# Patient Record
Sex: Female | Born: 1985 | Race: White | Hispanic: No | Marital: Single | Smoking: Current every day smoker
Health system: Southern US, Community
[De-identification: ages and names within clinical notes are randomized; demographics above are authoritative.]

## PROBLEM LIST (undated history)

## (undated) DIAGNOSIS — E119 Type 2 diabetes mellitus without complications: Secondary | ICD-10-CM

## (undated) DIAGNOSIS — G43909 Migraine, unspecified, not intractable, without status migrainosus: Secondary | ICD-10-CM

## (undated) DIAGNOSIS — F209 Schizophrenia, unspecified: Secondary | ICD-10-CM

## (undated) HISTORY — PX: LAPAROSCOPIC OOPHERECTOMY: SHX6507

## (undated) HISTORY — PX: GANGLION CYST EXCISION: SHX1691

---

## 2008-08-29 ENCOUNTER — Emergency Department: Payer: Self-pay | Admitting: Unknown Physician Specialty

## 2008-09-05 ENCOUNTER — Emergency Department: Payer: Self-pay | Admitting: Emergency Medicine

## 2008-09-15 ENCOUNTER — Emergency Department: Payer: Self-pay | Admitting: Emergency Medicine

## 2008-10-03 ENCOUNTER — Emergency Department: Payer: Self-pay | Admitting: Emergency Medicine

## 2008-10-28 ENCOUNTER — Emergency Department: Payer: Self-pay

## 2008-12-13 ENCOUNTER — Inpatient Hospital Stay: Payer: Self-pay | Admitting: Psychiatry

## 2009-01-15 ENCOUNTER — Ambulatory Visit: Payer: Self-pay | Admitting: Unknown Physician Specialty

## 2009-03-13 ENCOUNTER — Emergency Department: Payer: Self-pay | Admitting: Emergency Medicine

## 2009-08-23 ENCOUNTER — Inpatient Hospital Stay: Payer: Self-pay | Admitting: Psychiatry

## 2009-09-13 ENCOUNTER — Emergency Department: Payer: Self-pay | Admitting: Emergency Medicine

## 2010-03-06 ENCOUNTER — Emergency Department: Payer: Self-pay | Admitting: Emergency Medicine

## 2010-04-07 ENCOUNTER — Inpatient Hospital Stay: Payer: Self-pay | Admitting: Unknown Physician Specialty

## 2010-04-16 ENCOUNTER — Inpatient Hospital Stay: Payer: Self-pay | Admitting: Psychiatry

## 2010-04-27 ENCOUNTER — Emergency Department: Payer: Self-pay | Admitting: Emergency Medicine

## 2010-06-21 ENCOUNTER — Emergency Department: Payer: Self-pay | Admitting: Emergency Medicine

## 2010-08-20 ENCOUNTER — Inpatient Hospital Stay: Payer: Self-pay | Admitting: Psychiatry

## 2011-05-18 ENCOUNTER — Emergency Department: Payer: Self-pay

## 2011-07-07 ENCOUNTER — Emergency Department: Payer: Self-pay | Admitting: Emergency Medicine

## 2012-02-28 IMAGING — CR DG CHEST 2V
1 series · 2 of 2 positions shown · non-contrast
Comparison: none

REASON FOR EXAM: cough
COMMENTS:

PROCEDURE:     DXR - DXR CHEST PA (OR AP) AND LATERAL  - April 07, 2010  [DATE]
RESULT:     There is mild basilar atelectasis. The lungs are otherwise
clear. The cardiovascular structures are unremarkable.

[Series 1: view not recorded · 0.17mm/px · 2 of 2 slices shown]
[im 1/2]
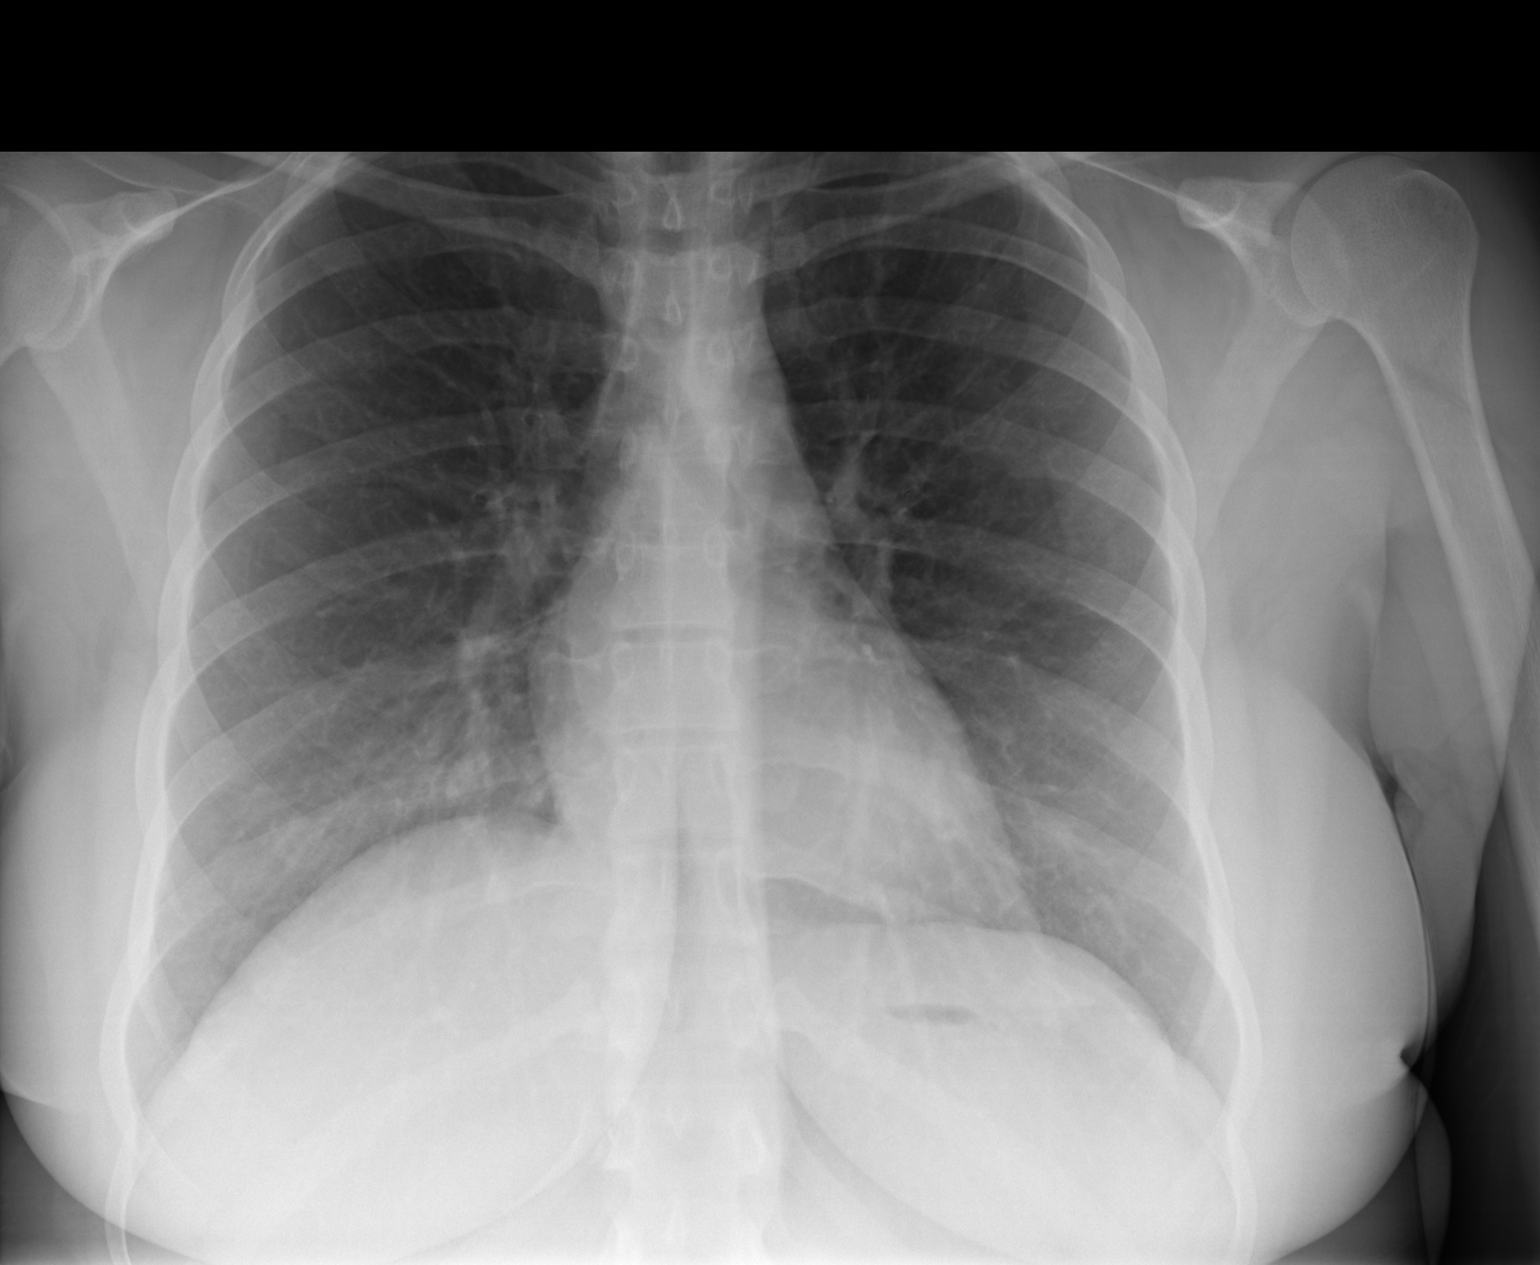
[im 2/2]
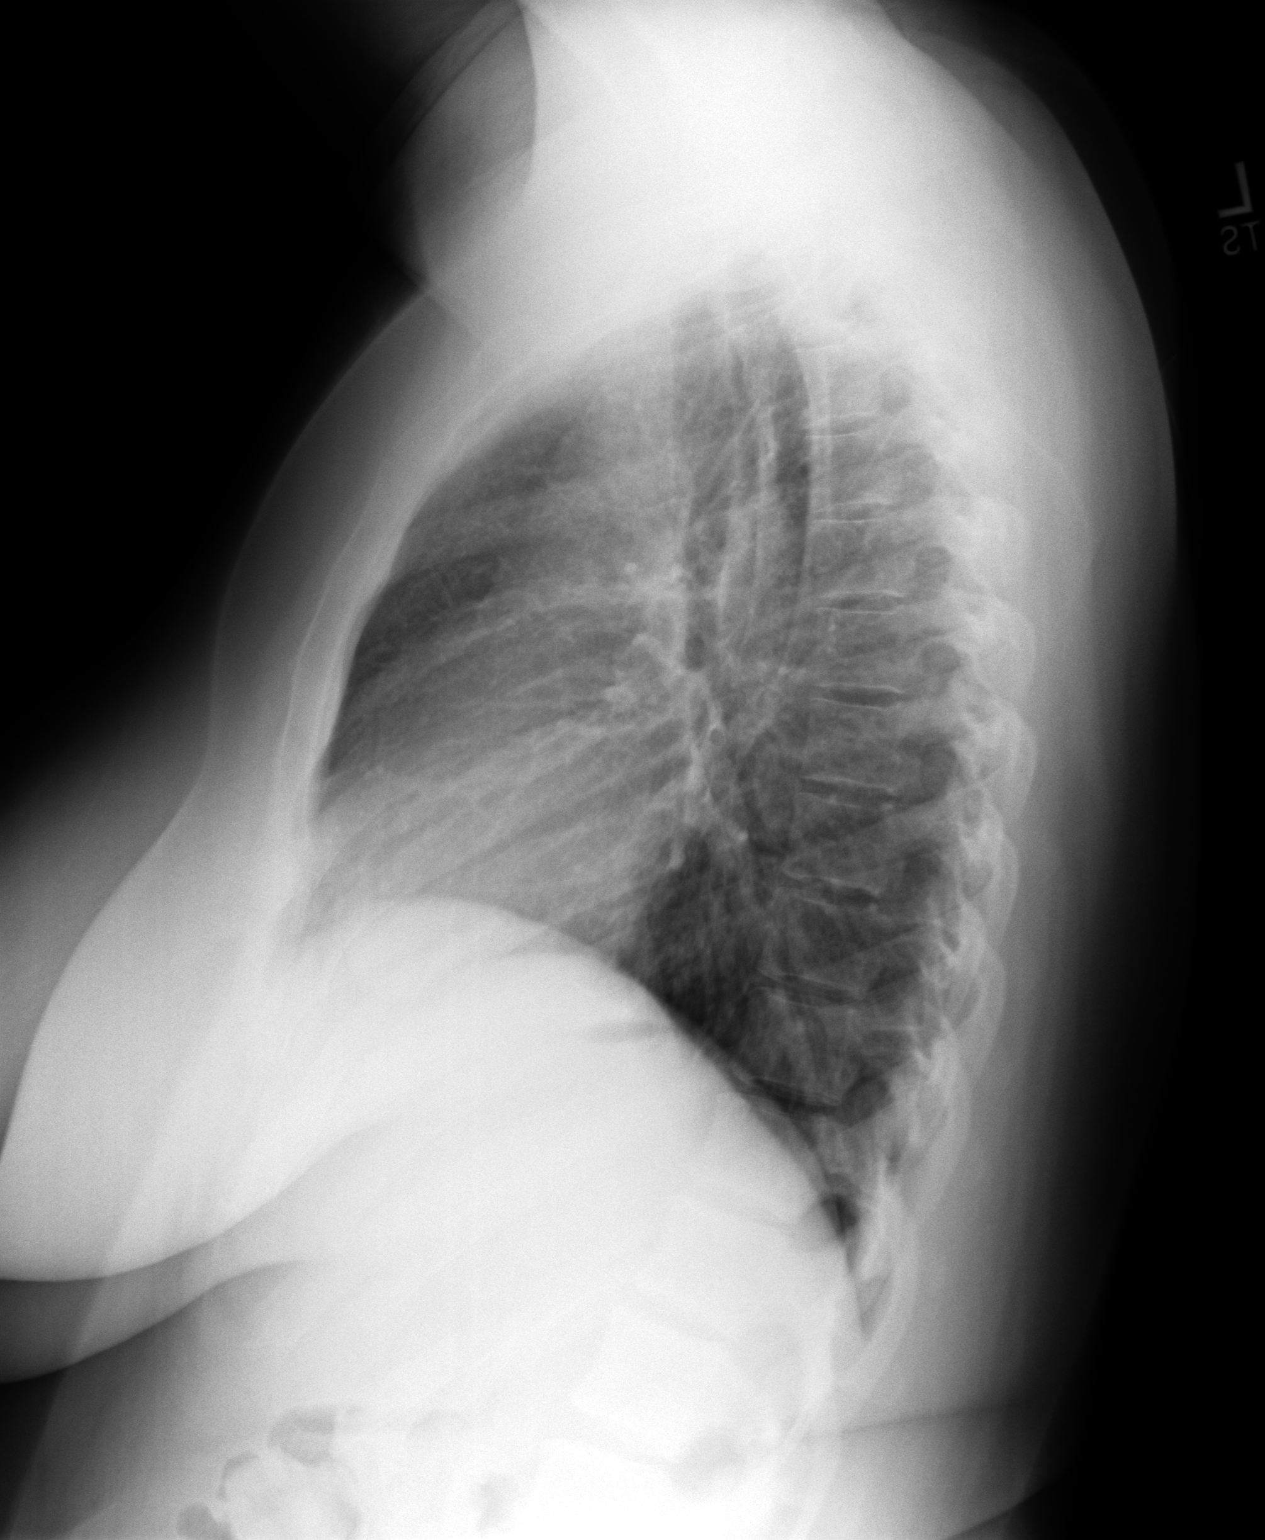

[2 of 2 positions shown; findings below may reference images not displayed]

IMPRESSION: Mild basilar atelectasis.

## 2013-10-18 ENCOUNTER — Ambulatory Visit: Payer: Self-pay | Admitting: Surgery

## 2013-10-18 LAB — CBC WITH DIFFERENTIAL/PLATELET
Basophil #: 0.1 10*3/uL (ref 0.0–0.1)
Basophil %: 0.8 %
EOS PCT: 3.3 %
Eosinophil #: 0.6 10*3/uL (ref 0.0–0.7)
HCT: 40.6 % (ref 35.0–47.0)
HGB: 13.5 g/dL (ref 12.0–16.0)
Lymphocyte #: 4.4 10*3/uL — ABNORMAL HIGH (ref 1.0–3.6)
Lymphocyte %: 24.9 %
MCH: 29.8 pg (ref 26.0–34.0)
MCHC: 33.2 g/dL (ref 32.0–36.0)
MCV: 90 fL (ref 80–100)
MONOS PCT: 5.1 %
Monocyte #: 0.9 x10 3/mm (ref 0.2–0.9)
NEUTROS PCT: 65.9 %
Neutrophil #: 11.6 10*3/uL — ABNORMAL HIGH (ref 1.4–6.5)
PLATELETS: 400 10*3/uL (ref 150–440)
RBC: 4.52 10*6/uL (ref 3.80–5.20)
RDW: 13.5 % (ref 11.5–14.5)
WBC: 17.6 10*3/uL — ABNORMAL HIGH (ref 3.6–11.0)

## 2013-10-18 LAB — BASIC METABOLIC PANEL WITH GFR
Anion Gap: 12
BUN: 10 mg/dL
Calcium, Total: 9.5 mg/dL
Chloride: 98 mmol/L
Co2: 28 mmol/L
Creatinine: 0.85 mg/dL
EGFR (African American): >60
EGFR (Non-African Amer.): >60
Glucose: 295 mg/dL — ABNORMAL HIGH
Osmolality: 286
Potassium: 4 mmol/L
Sodium: 138 mmol/L

## 2013-10-19 ENCOUNTER — Ambulatory Visit: Payer: Self-pay | Admitting: Surgery

## 2017-03-30 ENCOUNTER — Emergency Department: Payer: Medicare Other

## 2017-03-30 ENCOUNTER — Encounter: Payer: Self-pay | Admitting: Emergency Medicine

## 2017-03-30 DIAGNOSIS — S9031XA Contusion of right foot, initial encounter: Secondary | ICD-10-CM | POA: Diagnosis not present

## 2017-03-30 DIAGNOSIS — W109XXA Fall (on) (from) unspecified stairs and steps, initial encounter: Secondary | ICD-10-CM | POA: Diagnosis not present

## 2017-03-30 DIAGNOSIS — Y999 Unspecified external cause status: Secondary | ICD-10-CM | POA: Insufficient documentation

## 2017-03-30 DIAGNOSIS — Y929 Unspecified place or not applicable: Secondary | ICD-10-CM | POA: Insufficient documentation

## 2017-03-30 DIAGNOSIS — Y9301 Activity, walking, marching and hiking: Secondary | ICD-10-CM | POA: Insufficient documentation

## 2017-03-30 DIAGNOSIS — F17210 Nicotine dependence, cigarettes, uncomplicated: Secondary | ICD-10-CM | POA: Diagnosis not present

## 2017-03-30 DIAGNOSIS — S99921A Unspecified injury of right foot, initial encounter: Secondary | ICD-10-CM | POA: Diagnosis present

## 2017-03-30 DIAGNOSIS — E119 Type 2 diabetes mellitus without complications: Secondary | ICD-10-CM | POA: Insufficient documentation

## 2017-03-30 NOTE — ED Triage Notes (Signed)
Pt to triage via w/c with no distress noted; st yest slipped down steps injuring rt foot

## 2017-03-31 ENCOUNTER — Emergency Department: Payer: Medicare Other

## 2017-03-31 ENCOUNTER — Emergency Department
Admission: EM | Admit: 2017-03-31 | Discharge: 2017-03-31 | Disposition: A | Discharge status: Home / Self Care | Payer: Medicare Other | Attending: Emergency Medicine | Admitting: Emergency Medicine

## 2017-03-31 DIAGNOSIS — S9031XA Contusion of right foot, initial encounter: Secondary | ICD-10-CM

## 2017-03-31 HISTORY — DX: Type 2 diabetes mellitus without complications: E11.9

## 2017-03-31 MED ORDER — IBUPROFEN 800 MG PO TABS
800.0000 mg | ORAL_TABLET | ORAL | Status: AC
  Administered 2017-03-31: 800 mg via ORAL
  Filled 2017-03-31: qty 1

## 2017-03-31 NOTE — ED Provider Notes (Signed)
[facility] Emergency Department Provider Note   ____________________________________________   First MD Initiated Contact with Patient 03/31/17 0211     (approximate)  I have reviewed the triage vital signs and the nursing notes.   HISTORY  Chief Complaint Foot Pain   HPI Olivia Olson is a 31 y.o. female here for evaluation of right foot pain  Patient reports she was walking down a slippery step when she fell striking her foot getsagainst a ledge. This occurred about 2 days ago. Since that time the foot has been sore and slightly swollen across the top. She is able to walk on a per reports to uncomfortable. Has also noticed a slight aching and discomfort just below the knee joint when bearing weight, pointing to the lateral proximal tib-fib. She did not see any cut or injury there. Denies pregnancy. She is a type I diabetic, reports her glucose has been good well-controlled and she's had an increased insulin regimen recently  No fevers or chills. No redness.no numbness or tingling. No head injury. Denies any other injuries. Denies pregnancy  Past Medical History:  Diagnosis Date  . Diabetes mellitus without complication (HCC)     There are no active problems to display for this patient.   Past Surgical History:  Procedure Laterality Date  . CESAREAN SECTION    . GANGLION CYST EXCISION Left   . LAPAROSCOPIC OOPHERECTOMY      Prior to Admission medications   Not on File  insulin  Allergies Morphine and related; Risperdal [risperidone]; and Vicodin [hydrocodone-acetaminophen]  No family history on file.  Social History Social History  Substance Use Topics  . Smoking status: Current Every Day Smoker    Packs/day: 0.50    Types: Cigarettes  . Smokeless tobacco: Not on file  . Alcohol use Not on file    Review of Systems Constitutional: No fever/chills Cardiovascular: Denies chest pain. Respiratory: Denies shortness of  breath. Gastrointestinal: No abdominal pain.  No nausea, no vomiting.  Genitourinary: Negative for dysuria. Musculoskeletal: Negative for back pain. Skin: Negative for rash. Neurological: Negative for headaches, focal weakness or numbness.    ____________________________________________   PHYSICAL EXAM:  VITAL SIGNS: ED Triage Vitals  Enc Vitals Group     BP 03/30/17 2340 (!) 147/93     Pulse Rate 03/30/17 2340 (!) 120     Resp 03/30/17 2340 20     Temp 03/30/17 2340 98.3 F (36.8 C)     Temp Source 03/30/17 2340 Oral     SpO2 03/30/17 2340 98 %     Weight 03/30/17 2337 230 lb (104.3 kg)     Height 03/30/17 2337  (1.702 m)     Head Circumference --      Peak Flow --      Pain Score 03/30/17 2337 7     Pain Loc --      Pain Edu? --      Excl. in GC? --     Constitutional: Alert and oriented. Well appearing and in no acute distress.patient and her mother are both very pleasant Eyes: Conjunctivae are normal. Head: Atraumatic. Nose: No congestion/rhinnorhea. Mouth/Throat: Mucous membranes are moist. Neck: No stridor.   Cardiovascular: Normal rate, regular rhythm. Grossly normal heart sounds.  Good peripheral circulation. Respiratory: Normal respiratory effort.  No retractions. Lungs CTAB. Gastrointestinal: Soft and nontender. somewhat obese. Musculoskeletal:   Lower Extremities  No edema. Normal DP/PT pulses bilateral with good cap refill.  Normal neuro-motor function lower  extremities bilateral.  RIGHT Right lower extremity demonstrates normal strength, good use of all muscles. No edema bruising or contusions of the right hip, right knee, right ankle. the dorsal surface of the foot midfoot is slightly swollen and minimally tender to pain without deformity. There is also mild tenderness over the proximal fibula without deformity or overlying lesion. Full range of motion of the knee without deficit. Normal strength throughout. Normal dorsi and plantar flexion. Mild  to moderate tenderness over the anterior tibial fibular ligament. Full range of motion of the right lower extremity without pain. No pain on axial loading. No evidence of trauma.  LEFT Left lower extremity demonstrates normal strength, good use of all muscles. No edema bruising or contusions of the hip,  knee, ankle. Full range of motion of the left lower extremity without pain. No pain on axial loading. No evidence of trauma.   Neurologic:  Normal speech and language. No gross focal neurologic deficits are appreciated.  Skin:  Skin is warm, dry and intact. No rash noted. Psychiatric: Mood and affect are normal. Speech and behavior are normal.  ____________________________________________   LABS (all labs ordered are listed, but only abnormal results are displayed)  Labs Reviewed - No data to display ____________________________________________  EKG   ____________________________________________  RADIOLOGY  x-rays of the right tib-fib and foot are negative for fracture or acute. ____________________________________________   PROCEDURES  Procedure(s) performed: None  Procedures  Critical Care performed: No  ____________________________________________   INITIAL IMPRESSION / ASSESSMENT AND PLAN / ED COURSE  Pertinent labs & imaging results that were available during my care of the patient were reviewed by me and considered in my medical decision making (see chart for details).  fall. No head injury or chest or abdominal trauma. Alert and well oriented, nontoxic. Initially slightly tachycardic on arrival, but normal on my exam.  she has evidence of likely ligamentous sprain of the right foot. No pain or discomfort over the ankle joint. Minimal tenderness over the fibula, an x-ray is negative.  Discussed conservative measures, weightbearing as tolerated, Ace wrap and close follow-up. Return precautions and treatment recommendations and follow-up discussed with the patient  who is agreeable with the plan.       ____________________________________________   FINAL CLINICAL IMPRESSION(S) / ED DIAGNOSES  Final diagnoses:  Contusion of right foot, initial encounter      NEW MEDICATIONS STARTED DURING THIS VISIT:  New Prescriptions   No medications on file     Note:  This document was prepared using Dragon voice recognition software and may include unintentional dictation errors.     Sharyn Creamer, MD 03/31/17 [phone]

## 2018-03-24 ENCOUNTER — Other Ambulatory Visit: Payer: Self-pay

## 2018-03-24 ENCOUNTER — Emergency Department: Payer: Medicare Other

## 2018-03-24 ENCOUNTER — Emergency Department
Admission: EM | Admit: 2018-03-24 | Discharge: 2018-03-24 | Disposition: A | Discharge status: Home / Self Care | Payer: Medicare Other | Attending: Student in an Organized Health Care Education/Training Program | Admitting: Student in an Organized Health Care Education/Training Program

## 2018-03-24 DIAGNOSIS — S63502A Unspecified sprain of left wrist, initial encounter: Secondary | ICD-10-CM | POA: Insufficient documentation

## 2018-03-24 DIAGNOSIS — Y929 Unspecified place or not applicable: Secondary | ICD-10-CM | POA: Insufficient documentation

## 2018-03-24 DIAGNOSIS — W0110XA Fall on same level from slipping, tripping and stumbling with subsequent striking against unspecified object, initial encounter: Secondary | ICD-10-CM | POA: Insufficient documentation

## 2018-03-24 DIAGNOSIS — E119 Type 2 diabetes mellitus without complications: Secondary | ICD-10-CM | POA: Insufficient documentation

## 2018-03-24 DIAGNOSIS — S6992XA Unspecified injury of left wrist, hand and finger(s), initial encounter: Secondary | ICD-10-CM | POA: Diagnosis present

## 2018-03-24 DIAGNOSIS — F17210 Nicotine dependence, cigarettes, uncomplicated: Secondary | ICD-10-CM | POA: Diagnosis not present

## 2018-03-24 DIAGNOSIS — Y999 Unspecified external cause status: Secondary | ICD-10-CM | POA: Insufficient documentation

## 2018-03-24 DIAGNOSIS — Y9301 Activity, walking, marching and hiking: Secondary | ICD-10-CM | POA: Diagnosis not present

## 2018-03-24 NOTE — ED Triage Notes (Signed)
Pt tripped and tried to catch herself by putting hands down. Now co left hand and wrist pain. No obvious deformity noted.

## 2018-03-24 NOTE — ED Provider Notes (Signed)
[facility] Emergency Department Provider Note  ____________________________________________   First MD Initiated Contact with Patient 03/24/18 2137     (approximate)  I have reviewed the triage vital signs and the nursing notes.   HISTORY  Chief Complaint Wrist Pain    HPI Olivia Olson is a 32 y.o. female presents emergency department complaining of left hand and wrist pain.  She states she tripped and tried to catch herself by putting her hand down.  She fell on outstretched hand.  She denies any other injuries.  She is just concerned is that hurts to move her hand and wrist.    Past Medical History:  Diagnosis Date  . Diabetes mellitus without complication (HCC)     There are no active problems to display for this patient.   Past Surgical History:  Procedure Laterality Date  . CESAREAN SECTION    . GANGLION CYST EXCISION Left   . LAPAROSCOPIC OOPHERECTOMY      Prior to Admission medications   Not on File    Allergies Abilify [aripiprazole]; Morphine and related; Risperdal [risperidone]; Vicodin [hydrocodone-acetaminophen]; and Codeine  No family history on file.  Social History Social History   Tobacco Use  . Smoking status: Current Every Day Smoker    Packs/day: 0.50    Types: Cigarettes  Substance Use Topics  . Alcohol use: Not on file  . Drug use: Not on file    Review of Systems  Constitutional: No fever/chills Eyes: No visual changes. ENT: No sore throat. Respiratory: Denies cough Genitourinary: Negative for dysuria. Musculoskeletal: Negative for back pain.  Positive for left hand and wrist pain Skin: Negative for rash.    ____________________________________________   PHYSICAL EXAM:  VITAL SIGNS: ED Triage Vitals  Enc Vitals Group     BP 03/24/18 2033 130/66     Pulse Rate 03/24/18 2033 92     Resp 03/24/18 2033 18     Temp 03/24/18 2033 98.2 F (36.8 C)     Temp Source 03/24/18 2033 Oral   SpO2 03/24/18 2033 100 %     Weight 03/24/18 2034 210 lb (95.3 kg)     Height 03/24/18 2034 5\' 7"  (1.702 m)     Head Circumference --      Peak Flow --      Pain Score 03/24/18 2034 5     Pain Loc --      Pain Edu? --      Excl. in GC? --     Constitutional: Alert and oriented. Well appearing and in no acute distress. Eyes: Conjunctivae are normal.  Head: Atraumatic. Nose: No congestion/rhinnorhea. Mouth/Throat: Mucous membranes are moist.   Neck:  supple no lymphadenopathy noted Cardiovascular: Normal rate, regular rhythm.  Respiratory: Normal respiratory effort.  No retractions GU: deferred Musculoskeletal: FROM all extremities, warm and well perfused, the left wrist and hand are mildly tender to palpation.  There is no tenderness noted in the snuffbox.  No pain is reproduced with pressure on the thumb.  She is neurovascularly intact. Neurologic:  Normal speech and language.  Skin:  Skin is warm, dry and intact. No rash noted. Psychiatric: Mood and affect are normal. Speech and behavior are normal.  ____________________________________________   LABS (all labs ordered are listed, but only abnormal results are displayed)  Labs Reviewed - No data to display ____________________________________________   ____________________________________________  RADIOLOGY  X-ray of the left hand is negative for any acute abnormality.  Carpal bones appear to be  intact.  ____________________________________________   PROCEDURES  Procedure(s) performed: Velcro cock-up splint applied by nursing staff  Procedures    ____________________________________________   INITIAL IMPRESSION / ASSESSMENT AND PLAN / ED COURSE  Pertinent labs & imaging results that were available during my care of the patient were reviewed by me and considered in my medical decision making (see chart for details).   Patient is a 32 year old female presents emergency department complaining of left hand and  wrist pain.  She is tripped and fell on outstretched hand.  No other injuries are reported.  On physical exam the left hand is tender to palpation across the distal metacarpals.  The carpal bones are not tender.  No tenderness in the snuffbox.  Neurovascular is intact.  X-ray of the left hand is negative for any acute abnormality.  X-ray results were discussed with patient.  She was given a Velcro cock-up splint.  She is to follow-up with orthopedics if not better in 5 7 days.  Return emergency department if worsening.  She is to take over-the-counter Tylenol and ibuprofen for pain as needed.  She states she understands will comply their instructions.  She is discharged in stable condition     As part of my medical decision making, I reviewed the following data within the electronic MEDICAL RECORD NUMBER Nursing notes reviewed and incorporated, Old chart reviewed, Radiograph reviewed tray of the left hand is negative for any acute abnormality, Notes from prior ED visits and [place] Controlled Substance Database  ____________________________________________   FINAL CLINICAL IMPRESSION(S) / ED DIAGNOSES  Final diagnoses:  Sprain of left wrist, initial encounter      NEW MEDICATIONS STARTED DURING THIS VISIT:  There are no discharge medications for this patient.    Note:  This document was prepared using Dragon voice recognition software and may include unintentional dictation errors.    Faythe Ghee, PA-C 03/24/18 2313    Willy Eddy, MD 03/24/18 [phone]

## 2018-03-24 NOTE — Discharge Instructions (Addendum)
Follow-up with your regular care or [facility] if not improving in 5 7 days.  Take Tylenol and ibuprofen for pain as needed.  Apply ice.  Wear the wrist splint for 1 to 2 weeks as needed.

## 2018-10-12 ENCOUNTER — Other Ambulatory Visit: Payer: Self-pay

## 2018-10-12 ENCOUNTER — Emergency Department
Admission: EM | Admit: 2018-10-12 | Discharge: 2018-10-12 | Disposition: A | Discharge status: Home / Self Care | Payer: Medicare Other | Attending: Emergency Medicine | Admitting: Emergency Medicine

## 2018-10-12 ENCOUNTER — Encounter: Payer: Self-pay | Admitting: Emergency Medicine

## 2018-10-12 DIAGNOSIS — R1110 Vomiting, unspecified: Secondary | ICD-10-CM | POA: Diagnosis not present

## 2018-10-12 DIAGNOSIS — R51 Headache: Secondary | ICD-10-CM | POA: Insufficient documentation

## 2018-10-12 DIAGNOSIS — R519 Headache, unspecified: Secondary | ICD-10-CM

## 2018-10-12 DIAGNOSIS — F17210 Nicotine dependence, cigarettes, uncomplicated: Secondary | ICD-10-CM | POA: Diagnosis not present

## 2018-10-12 DIAGNOSIS — E119 Type 2 diabetes mellitus without complications: Secondary | ICD-10-CM | POA: Insufficient documentation

## 2018-10-12 HISTORY — DX: Migraine, unspecified, not intractable, without status migrainosus: G43.909

## 2018-10-12 LAB — COMPREHENSIVE METABOLIC PANEL
ALT: 29 U/L (ref 0–44)
AST: 17 U/L (ref 15–41)
Albumin: 5 g/dL (ref 3.5–5.0)
Alkaline Phosphatase: 100 U/L (ref 38–126)
Anion gap: 11 (ref 5–15)
BUN: 8 mg/dL (ref 6–20)
CO2: 22 mmol/L (ref 22–32)
Calcium: 9.6 mg/dL (ref 8.9–10.3)
Chloride: 103 mmol/L (ref 98–111)
Creatinine, Ser: 0.54 mg/dL (ref 0.44–1.00)
GFR calc Af Amer: >60 mL/min (ref >60)
GFR calc non Af Amer: >60 mL/min (ref >60)
Glucose, Bld: 200 mg/dL — ABNORMAL HIGH (ref 70–99)
Potassium: 4 mmol/L (ref 3.5–5.1)
Sodium: 136 mmol/L (ref 135–145)
Total Bilirubin: 0.8 mg/dL (ref 0.3–1.2)
Total Protein: 8.7 g/dL — ABNORMAL HIGH (ref 6.5–8.1)

## 2018-10-12 LAB — CBC WITH DIFFERENTIAL/PLATELET
Abs Immature Granulocytes: 0.13 10*3/uL — ABNORMAL HIGH (ref 0.00–0.07)
Abs Immature Granulocytes: 0.12 10*3/uL — ABNORMAL HIGH (ref 0.00–0.07)
Basophils Absolute: 0.1 10*3/uL (ref 0.0–0.1)
Basophils Absolute: 0.1 10*3/uL (ref 0.0–0.1)
Basophils Relative: 1 %
Basophils Relative: 1 %
Eosinophils Absolute: 0.8 10*3/uL — ABNORMAL HIGH (ref 0.0–0.5)
Eosinophils Absolute: 0.5 10*3/uL (ref 0.0–0.5)
Eosinophils Relative: 4 %
Eosinophils Relative: 3 %
HCT: 47.3 % — ABNORMAL HIGH (ref 36.0–46.0)
HCT: 43.8 % (ref 36.0–46.0)
Hemoglobin: 16.1 g/dL — ABNORMAL HIGH (ref 12.0–15.0)
Hemoglobin: 14.8 g/dL (ref 12.0–15.0)
Immature Granulocytes: 1 %
Immature Granulocytes: 1 %
Lymphocytes Relative: 21 %
Lymphocytes Relative: 20 %
Lymphs Abs: 4.2 10*3/uL — ABNORMAL HIGH (ref 0.7–4.0)
Lymphs Abs: 3.7 10*3/uL (ref 0.7–4.0)
MCH: 30.3 pg (ref 26.0–34.0)
MCH: 30.5 pg (ref 26.0–34.0)
MCHC: 34 g/dL (ref 30.0–36.0)
MCHC: 33.8 g/dL (ref 30.0–36.0)
MCV: 88.9 fL (ref 80.0–100.0)
MCV: 90.3 fL (ref 80.0–100.0)
Monocytes Absolute: 1 10*3/uL (ref 0.1–1.0)
Monocytes Absolute: 0.9 10*3/uL (ref 0.1–1.0)
Monocytes Relative: 5 %
Monocytes Relative: 5 %
Neutro Abs: 13.8 10*3/uL — ABNORMAL HIGH (ref 1.7–7.7)
Neutro Abs: 13.4 10*3/uL — ABNORMAL HIGH (ref 1.7–7.7)
Neutrophils Relative %: 68 %
Neutrophils Relative %: 70 %
Platelets: 393 10*3/uL (ref 150–400)
Platelets: 344 10*3/uL (ref 150–400)
RBC: 5.32 MIL/uL — ABNORMAL HIGH (ref 3.87–5.11)
RBC: 4.85 MIL/uL (ref 3.87–5.11)
RDW: 13.2 % (ref 11.5–15.5)
RDW: 13.2 % (ref 11.5–15.5)
WBC: 20 10*3/uL — ABNORMAL HIGH (ref 4.0–10.5)
WBC: 18.8 10*3/uL — ABNORMAL HIGH (ref 4.0–10.5)
nRBC: 0 % (ref 0.0–0.2)
nRBC: 0 % (ref 0.0–0.2)

## 2018-10-12 LAB — URINALYSIS, COMPLETE (UACMP) WITH MICROSCOPIC
Bilirubin Urine: NEGATIVE
Glucose, UA: NEGATIVE mg/dL
Hgb urine dipstick: NEGATIVE
Ketones, ur: 5 mg/dL — AB
Leukocytes,Ua: NEGATIVE
Nitrite: NEGATIVE
Protein, ur: 100 mg/dL — AB
Specific Gravity, Urine: 1.014 (ref 1.005–1.030)
pH: 5 (ref 5.0–8.0)

## 2018-10-12 LAB — GLUCOSE, CAPILLARY: Glucose-Capillary: 212 mg/dL — ABNORMAL HIGH (ref 70–99)

## 2018-10-12 LAB — POCT PREGNANCY, URINE: Preg Test, Ur: NEGATIVE

## 2018-10-12 MED ORDER — SODIUM CHLORIDE 0.9 % IV SOLN
Freq: Once | INTRAVENOUS | Status: AC
  Administered 2018-10-12: 13:00:00 via INTRAVENOUS

## 2018-10-12 MED ORDER — SODIUM CHLORIDE 0.9 % IV SOLN
Freq: Once | INTRAVENOUS | Status: AC
  Administered 2018-10-12: 12:00:00 via INTRAVENOUS

## 2018-10-12 MED ORDER — KETOROLAC TROMETHAMINE 30 MG/ML IJ SOLN
30.0000 mg | Freq: Once | INTRAMUSCULAR | Status: AC
  Administered 2018-10-12: 30 mg via INTRAVENOUS
  Filled 2018-10-12: qty 1

## 2018-10-12 MED ORDER — ONDANSETRON 4 MG PO TBDP: 4.0000 mg | ORAL_TABLET | Freq: Three times a day (TID) | ORAL | 0 refills | Status: AC | PRN

## 2018-10-12 MED ORDER — BUTALBITAL-APAP-CAFFEINE 50-325-40 MG PO TABS: 1.0000 | ORAL_TABLET | Freq: Four times a day (QID) | ORAL | 0 refills | Status: AC | PRN

## 2018-10-12 MED ORDER — METOCLOPRAMIDE HCL 5 MG/ML IJ SOLN
10.0000 mg | Freq: Once | INTRAMUSCULAR | Status: AC
  Administered 2018-10-12: 10 mg via INTRAVENOUS
  Filled 2018-10-12: qty 2

## 2018-10-12 NOTE — ED Provider Notes (Signed)
[facility] Emergency Department Provider Note       Time seen: ----------------------------------------- 11:06 AM on 10/12/2018 -----------------------------------------   I have reviewed the triage vital signs and the nursing notes.  HISTORY   Chief Complaint Migraine    HPI Olivia Olson is a 33 y.o. female with a history of diabetes and migraines who presents to the ED for migraine headache for the past 2 days.  Patient presents actively vomiting and is diaphoretic.  Headache started last night.  Patient is not sure what her blood sugar is.  Past Medical History:  Diagnosis Date  . Diabetes mellitus without complication (HCC)   . Migraines     There are no active problems to display for this patient.   Past Surgical History:  Procedure Laterality Date  . CESAREAN SECTION    . GANGLION CYST EXCISION Left   . LAPAROSCOPIC OOPHERECTOMY      Allergies Abilify [aripiprazole]; Codeine; Morphine and related; Risperdal [risperidone]; and Vicodin [hydrocodone-acetaminophen]  Social History Social History   Tobacco Use  . Smoking status: Current Every Day Smoker    Packs/day: 0.50    Types: Cigarettes  . Smokeless tobacco: Never Used  Substance Use Topics  . Alcohol use: Never    Frequency: Never  . Drug use: Yes    Types: Marijuana   Review of Systems Constitutional: Negative for fever. Cardiovascular: Negative for chest pain. Respiratory: Negative for shortness of breath. Gastrointestinal: Negative for abdominal pain, positive for vomiting Musculoskeletal: Negative for back pain. Skin: Positive for diaphoresis Neurological: Positive for headache  All systems negative/normal/unremarkable except as stated in the HPI  ____________________________________________   PHYSICAL EXAM:  VITAL SIGNS: ED Triage Vitals  Enc Vitals Group     BP 10/12/18 1053 138/85     Pulse Rate 10/12/18 1053 (!) 104     Resp 10/12/18 1053 14   Temp 10/12/18 1053 97.6 F (36.4 C)     Temp Source 10/12/18 1053 Oral     SpO2 10/12/18 1053 94 %     Weight 10/12/18 1054 210 lb (95.3 kg)     Height 10/12/18 1054 5\' 7"  (1.702 m)     Head Circumference --      Peak Flow --      Pain Score 10/12/18 1053 10     Pain Loc --      Pain Edu? --      Excl. in GC? --     Constitutional: Alert and oriented.  Mild distress actively vomiting Eyes: Conjunctivae are normal. Normal extraocular movements. ENT      Head: Normocephalic and atraumatic.      Nose: No congestion/rhinnorhea.      Mouth/Throat: Mucous membranes are moist.      Neck: No stridor. Cardiovascular: Normal rate, regular rhythm. No murmurs, rubs, or gallops. Respiratory: Normal respiratory effort without tachypnea nor retractions. Breath sounds are clear and equal bilaterally. No wheezes/rales/rhonchi. Gastrointestinal: Soft and nontender. Normal bowel sounds Musculoskeletal: Nontender with normal range of motion in extremities. No lower extremity tenderness nor edema. Neurologic:  Normal speech and language. No gross focal neurologic deficits are appreciated.  Skin:  Skin is warm, dry and intact. No rash noted. Psychiatric: Mood and affect are normal. Speech and behavior are normal.  ____________________________________________  ED COURSE:  As part of my medical decision making, I reviewed the following data within the electronic MEDICAL RECORD NUMBER History obtained from family if available, nursing notes, old chart and ekg, as well as  notes from prior ED visits. Patient presented for vomiting with migraine headache, we will assess with labs and imaging as indicated at this time. Clinical Course as of Oct 11 1324  Wed Oct 12, 2018  1224 Patient reports she is feeling better at this time.   [JW]    Clinical Course User Index [JW] Emily Filbert, MD   Procedures Olivia Olson was evaluated in Emergency Department on 10/12/2018 for the symptoms described in the  history of present illness. She was evaluated in the context of the global COVID-19 pandemic, which necessitated consideration that the patient might be at risk for infection with the SARS-CoV-2 virus that causes COVID-19. Institutional protocols and algorithms that pertain to the evaluation of patients at risk for COVID-19 are in a state of rapid change based on information released by regulatory bodies including the CDC and federal and state organizations. These policies and algorithms were followed during the patient's care in the ED.  ____________________________________________   LABS (pertinent positives/negatives)  Labs Reviewed  CBC WITH DIFFERENTIAL/PLATELET - Abnormal; Notable for the following components:      Result Value   WBC 20.0 (*)    RBC 5.32 (*)    Hemoglobin 16.1 (*)    HCT 47.3 (*)    Neutro Abs 13.8 (*)    Lymphs Abs 4.2 (*)    Eosinophils Absolute 0.8 (*)    Abs Immature Granulocytes 0.13 (*)    All other components within normal limits  COMPREHENSIVE METABOLIC PANEL - Abnormal; Notable for the following components:   Glucose, Bld 200 (*)    Total Protein 8.7 (*)    All other components within normal limits  URINALYSIS, COMPLETE (UACMP) WITH MICROSCOPIC - Abnormal; Notable for the following components:   Color, Urine YELLOW (*)    APPearance HAZY (*)    Ketones, ur 5 (*)    Protein, ur 100 (*)    Bacteria, UA FEW (*)    All other components within normal limits  GLUCOSE, CAPILLARY - Abnormal; Notable for the following components:   Glucose-Capillary 212 (*)    All other components within normal limits  CBC WITH DIFFERENTIAL/PLATELET - Abnormal; Notable for the following components:   WBC 18.8 (*)    Neutro Abs 13.4 (*)    Abs Immature Granulocytes 0.12 (*)    All other components within normal limits  CBG MONITORING, ED  POC URINE PREG, ED  POCT PREGNANCY, URINE   ___________________________________________   DIFFERENTIAL DIAGNOSIS   Dehydration,  electrolyte abnormality, migraine headache, tension headache, DKA, gastroparesis  FINAL ASSESSMENT AND PLAN  Headache, vomiting   Plan: The patient had presented for headache and vomiting. Patient's labs did reveal leukocytosis which is likely acute on chronic.  I did give IV fluids and recheck her CBC which appeared to be improving.  She is in no distress at this time and feeling better.  I have advised that she should return for worsening or worrisome symptoms.   Ulice Dash, MD    Note: This note was generated in part or whole with voice recognition software. Voice recognition is usually quite accurate but there are transcription errors that can and very often do occur. I apologize for any typographical errors that were not detected and corrected.     Emily Filbert, MD 10/12/18 [phone]

## 2018-10-12 NOTE — ED Triage Notes (Signed)
First RN Note: Pt presents to ED via POV with c/o HA that started last night. Pt presents crying and dry heaving upon arrival. Pt slow to answer questions at this time but is alert and oriented.

## 2018-10-12 NOTE — ED Triage Notes (Signed)
Pt in via POV, reports migraine x 2 days, pt dry heaving in triage and diaphoretic.  Vitals WDL.

## 2018-10-12 NOTE — ED Notes (Signed)
Pt verbalized understanding of discharge instructions. NAD at this time. 

## 2018-10-12 NOTE — ED Notes (Signed)
Pt is being discharged to home. Aox4, VSS, pt does not c/o any pain or nausea at this time. AVS and RX was given and explained to the pt and she verbalized understanding of all information.

## 2019-07-02 ENCOUNTER — Emergency Department: Payer: Medicare Other

## 2019-07-02 ENCOUNTER — Other Ambulatory Visit: Payer: Self-pay

## 2019-07-02 ENCOUNTER — Emergency Department
Admission: EM | Admit: 2019-07-02 | Discharge: 2019-07-02 | Disposition: A | Discharge status: Home / Self Care | Payer: Medicare Other | Attending: Emergency Medicine | Admitting: Emergency Medicine

## 2019-07-02 DIAGNOSIS — Z79899 Other long term (current) drug therapy: Secondary | ICD-10-CM | POA: Diagnosis not present

## 2019-07-02 DIAGNOSIS — Z7984 Long term (current) use of oral hypoglycemic drugs: Secondary | ICD-10-CM | POA: Insufficient documentation

## 2019-07-02 DIAGNOSIS — E119 Type 2 diabetes mellitus without complications: Secondary | ICD-10-CM | POA: Diagnosis not present

## 2019-07-02 DIAGNOSIS — F17210 Nicotine dependence, cigarettes, uncomplicated: Secondary | ICD-10-CM | POA: Diagnosis not present

## 2019-07-02 DIAGNOSIS — M79672 Pain in left foot: Secondary | ICD-10-CM | POA: Diagnosis not present

## 2019-07-02 NOTE — ED Notes (Signed)
Provider at bedside

## 2019-07-02 NOTE — ED Triage Notes (Signed)
Reports coffee mug fell on left foot and now with pain and "crunching".

## 2019-07-02 NOTE — ED Provider Notes (Signed)
[facility] Emergency Department Provider Note ____________________________________________  Time seen: 2030  I have reviewed the triage vital signs and the nursing notes.  HISTORY  Chief Complaint  Foot Pain   HPI Olivia Olson is a 33 y.o. female presents to the clinic today with c/o left foot pain. She reports this started just PTA after a coffee cup fell off her fireplace and landed on her left foot. She describes the pain as sharp, and reports she can hear a crunching sound with ambulation. She has noticed some swelling but denies bruising or abrasion. She has applied ice but has not taken any medication for this.  Past Medical History:  Diagnosis Date  . Diabetes mellitus without complication ([place])   . Migraines     There are no problems to display for this patient.   Past Surgical History:  Procedure Laterality Date  . CESAREAN SECTION    . GANGLION CYST EXCISION Left   . LAPAROSCOPIC OOPHERECTOMY      Prior to Admission medications   Medication Sig Start Date End Date Taking? Authorizing Provider  buPROPion (WELLBUTRIN SR) 100 MG 12 hr tablet Take 100 mg by mouth 2 (two) times daily.    [provider]  butalbital-acetaminophen-caffeine (FIORICET, ESGIC) 50-325-40 MG tablet Take 1-2 tablets by mouth every 6 (six) hours as needed. 10/12/18 10/12/19  Earleen Newport, MD  gabapentin (NEURONTIN) 100 MG capsule Take 100 mg by mouth 3 (three) times daily.    [provider]  metFORMIN (GLUCOPHAGE) 1000 MG tablet Take 1,000 mg by mouth 2 (two) times daily.    [provider]  ondansetron (ZOFRAN ODT) 4 MG disintegrating tablet Take 1 tablet (4 mg total) by mouth every 8 (eight) hours as needed for nausea or vomiting. 10/12/18   Earleen Newport, MD  sertraline (ZOLOFT) 100 MG tablet Take 100 mg by mouth 2 (two) times daily.    [provider]    Allergies Codeine, Abilify [aripiprazole], Morphine and  related, Risperdal [risperidone], and Vicodin [hydrocodone-acetaminophen]  No family history on file.  Social History Social History   Tobacco Use  . Smoking status: Current Every Day Smoker    Packs/day: 0.50    Types: Cigarettes  . Smokeless tobacco: Never Used  Substance Use Topics  . Alcohol use: Never  . Drug use: Yes    Types: Marijuana    Review of Systems  Constitutional: Negative for fever. Cardiovascular: Negative for chest pain. Respiratory: Negative for shortness of breath. Musculoskeletal: Positive for left foot pain and swelling. Negative for ankle pain. Skin: Negative for bruising or abrasion. Neurological: Positive for focal weakness of the left foot. Negative for tingling and numbness. ____________________________________________  PHYSICAL EXAM:  VITAL SIGNS: ED Triage Vitals [07/02/19 2019]  Enc Vitals Group     BP 136/80     Pulse Rate 95     Resp 18     Temp 99.1 F (37.3 C)     Temp Source Oral     SpO2 98 %     Weight 208 lb (94.3 kg)     Height 5\' 7"  (1.702 m)     Head Circumference      Peak Flow      Pain Score 6     Pain Loc      Pain Edu?      Excl. in [place]?     Constitutional: Alert and oriented. Well appearing and in no distress. Cardiovascular: Normal rate, regular rhythm. Pedal  pulses 2+ bilaterally. Respiratory: Normal respiratory effort. No wheezes/rales/rhonchi. Musculoskeletal: Normal flexion, extension and rotation of the left ankle. No pain with palpation of the left ankle. Pain with palpation along the 1st metatarsal. She is able to wiggle toes. Pain with resistance to plantarflexion and dorsiflexion. Neurologic:  Normal gait without ataxia. Normal speech and language. No gross focal neurologic deficits are appreciated. Skin:  Skin is warm, dry and intact. No bruising or abrasion noted.  ____________________________________________   RADIOLOGY  Imaging Orders     DG Foot Complete Left  IMPRESSION:  Negative.    ____________________________________________    INITIAL IMPRESSION / ASSESSMENT AND PLAN / ED COURSE  Left Foot Pain and Swelling:  Xray left foot- negative for acute fracture ACE wrap applied for swelling and comfort Weight bearing as tolerated Encouraged RICE therapy Return precautions discussed    ___________________________  FINAL CLINICAL IMPRESSION(S) / ED DIAGNOSES  Final diagnoses:  Foot pain, left  Nicki Reaper, NP     Lorre Munroe, NP 07/02/19 2049    Sharman Cheek, MD 07/02/19 [phone]

## 2019-07-02 NOTE — Discharge Instructions (Addendum)
You were seen today for left foot pain. Your xray is negative for fracture. I encourage rest, ice, elevation and compression. We have wrapped your foot in an ACE wrap. You can take Ibuprofen 600 mg every 8 hours as needed with food. Follow up with your PCP if symptoms persist or worsen.

## 2019-11-29 ENCOUNTER — Telehealth: Payer: Self-pay | Admitting: Obstetrics & Gynecology

## 2019-11-29 NOTE — Telephone Encounter (Signed)
[facility] referring for Pap smear hx of abnormal needs yearly pap smears. Called and left voicemail for patient to call back to be scheduled.

## 2019-12-01 NOTE — Telephone Encounter (Signed)
Called and left voicemail for patient to call back to be scheduled. 

## 2019-12-05 NOTE — Telephone Encounter (Signed)
Called and left voicemail for patient to call back to be scheduled. 

## 2019-12-07 NOTE — Telephone Encounter (Signed)
Called and left voicemail for patient to call back to be scheduled. Called and spoke with [place] about attempts to reach patient were unsuccessful.

## 2019-12-07 NOTE — Telephone Encounter (Signed)
Called and left voicemail for patient to call back to be scheduled. 

## 2021-05-09 ENCOUNTER — Encounter: Payer: Self-pay | Admitting: Emergency Medicine

## 2021-05-09 ENCOUNTER — Other Ambulatory Visit: Payer: Self-pay

## 2021-05-09 DIAGNOSIS — E119 Type 2 diabetes mellitus without complications: Secondary | ICD-10-CM | POA: Diagnosis not present

## 2021-05-09 DIAGNOSIS — Z7984 Long term (current) use of oral hypoglycemic drugs: Secondary | ICD-10-CM | POA: Diagnosis not present

## 2021-05-09 DIAGNOSIS — N390 Urinary tract infection, site not specified: Secondary | ICD-10-CM | POA: Insufficient documentation

## 2021-05-09 DIAGNOSIS — R112 Nausea with vomiting, unspecified: Secondary | ICD-10-CM | POA: Insufficient documentation

## 2021-05-09 DIAGNOSIS — F17210 Nicotine dependence, cigarettes, uncomplicated: Secondary | ICD-10-CM | POA: Diagnosis not present

## 2021-05-09 DIAGNOSIS — R300 Dysuria: Secondary | ICD-10-CM | POA: Insufficient documentation

## 2021-05-09 LAB — CBC
HCT: 44.7 % (ref 36.0–46.0)
Hemoglobin: 15.4 g/dL — ABNORMAL HIGH (ref 12.0–15.0)
MCH: 29.7 pg (ref 26.0–34.0)
MCHC: 34.5 g/dL (ref 30.0–36.0)
MCV: 86.3 fL (ref 80.0–100.0)
Platelets: 459 10*3/uL — ABNORMAL HIGH (ref 150–400)
RBC: 5.18 MIL/uL — ABNORMAL HIGH (ref 3.87–5.11)
RDW: 13.6 % (ref 11.5–15.5)
WBC: 19.6 10*3/uL — ABNORMAL HIGH (ref 4.0–10.5)
nRBC: 0 % (ref 0.0–0.2)

## 2021-05-09 LAB — COMPREHENSIVE METABOLIC PANEL
ALT: 24 U/L (ref 0–44)
AST: 16 U/L (ref 15–41)
Albumin: 4.4 g/dL (ref 3.5–5.0)
Alkaline Phosphatase: 92 U/L (ref 38–126)
Anion gap: 10 (ref 5–15)
BUN: 12 mg/dL (ref 6–20)
CO2: 24 mmol/L (ref 22–32)
Calcium: 9.8 mg/dL (ref 8.9–10.3)
Chloride: 99 mmol/L (ref 98–111)
Creatinine, Ser: 0.61 mg/dL (ref 0.44–1.00)
GFR, Estimated: >60 mL/min (ref >60)
Glucose, Bld: 163 mg/dL — ABNORMAL HIGH (ref 70–99)
Potassium: 3.5 mmol/L (ref 3.5–5.1)
Sodium: 133 mmol/L — ABNORMAL LOW (ref 135–145)
Total Bilirubin: 0.5 mg/dL (ref 0.3–1.2)
Total Protein: 8.4 g/dL — ABNORMAL HIGH (ref 6.5–8.1)

## 2021-05-09 LAB — LIPASE, BLOOD: Lipase: 27 U/L (ref 11–51)

## 2021-05-09 LAB — POC URINE PREG, ED: Preg Test, Ur: NEGATIVE

## 2021-05-09 MED ORDER — ONDANSETRON 4 MG PO TBDP
ORAL_TABLET | ORAL | Status: AC
  Administered 2021-05-09: 4 mg via ORAL
  Filled 2021-05-09: qty 1

## 2021-05-09 MED ORDER — ONDANSETRON 4 MG PO TBDP: 4.0000 mg | ORAL_TABLET | Freq: Once | ORAL | Status: AC | PRN

## 2021-05-09 NOTE — ED Triage Notes (Signed)
Pt to ED via POV with mother at bedside c/o vomiting x2 days. Pt st she thinks she has a bladder infection (reports dysuria/ denies abn. Discharge) and reports lower back/flank pain. Denies any fevers. Afebrile on arrival. Pt A&Ox4

## 2021-05-10 ENCOUNTER — Emergency Department
Admission: EM | Admit: 2021-05-10 | Discharge: 2021-05-10 | Disposition: A | Discharge status: Home / Self Care | Payer: Medicare Other | Attending: Emergency Medicine | Admitting: Emergency Medicine

## 2021-05-10 DIAGNOSIS — N390 Urinary tract infection, site not specified: Secondary | ICD-10-CM

## 2021-05-10 DIAGNOSIS — R112 Nausea with vomiting, unspecified: Secondary | ICD-10-CM

## 2021-05-10 LAB — URINALYSIS, ROUTINE W REFLEX MICROSCOPIC
Bilirubin Urine: NEGATIVE
Glucose, UA: 50 mg/dL — AB
Hgb urine dipstick: NEGATIVE
Ketones, ur: 5 mg/dL — AB
Nitrite: NEGATIVE
Protein, ur: 100 mg/dL — AB
Specific Gravity, Urine: 1.027 (ref 1.005–1.030)
pH: 5 (ref 5.0–8.0)

## 2021-05-10 MED ORDER — CEFDINIR 300 MG PO CAPS: 300.0000 mg | ORAL_CAPSULE | Freq: Two times a day (BID) | ORAL | 0 refills | Status: AC

## 2021-05-10 MED ORDER — ONDANSETRON 4 MG PO TBDP: 4.0000 mg | ORAL_TABLET | Freq: Three times a day (TID) | ORAL | 0 refills | Status: DC | PRN

## 2021-05-10 NOTE — ED Provider Notes (Signed)
[facility] Emergency Department Provider Note   ____________________________________________   None    (approximate)  I have reviewed the triage vital signs and the nursing notes.   HISTORY  Chief Complaint Emesis    HPI Olivia Olson is a 35 y.o. female who presents for dysuria  LOCATION: Suprapubic region DURATION: 2 days prior to arrival TIMING: Worsening since onset SEVERITY: Severe QUALITY: Aching pain CONTEXT: Patient states that beginning 2 days ago she has had dysuria, flank pain, and vomiting MODIFYING FACTORS: Denies any exacerbating or relieving factors ASSOCIATED SYMPTOMS: Flank pain, vomiting   Per medical record review, patient has history of type 2 diabetes          Past Medical History:  Diagnosis Date   Diabetes mellitus without complication (HCC)    Migraines     There are no problems to display for this patient.   Past Surgical History:  Procedure Laterality Date   CESAREAN SECTION     GANGLION CYST EXCISION Left    LAPAROSCOPIC OOPHERECTOMY      Prior to Admission medications   Medication Sig Start Date End Date Taking? Authorizing Provider  cefdinir (OMNICEF) 300 MG capsule Take 1 capsule (300 mg total) by mouth 2 (two) times daily for 10 days. 05/10/21 05/20/21 Yes Temiloluwa Laredo, Clent Jacks, MD  ondansetron (ZOFRAN ODT) 4 MG disintegrating tablet Take 1 tablet (4 mg total) by mouth every 8 (eight) hours as needed for nausea or vomiting. 05/10/21  Yes Merwyn Katos, MD  buPROPion (WELLBUTRIN SR) 100 MG 12 hr tablet Take 100 mg by mouth 2 (two) times daily.    [provider]  gabapentin (NEURONTIN) 100 MG capsule Take 100 mg by mouth 3 (three) times daily.    [provider]  metFORMIN (GLUCOPHAGE) 1000 MG tablet Take 1,000 mg by mouth 2 (two) times daily.    [provider]  ondansetron (ZOFRAN ODT) 4 MG disintegrating tablet Take 1 tablet (4 mg total) by mouth every 8 (eight) hours as  needed for nausea or vomiting. 10/12/18   Emily Filbert, MD  sertraline (ZOLOFT) 100 MG tablet Take 100 mg by mouth 2 (two) times daily.    [provider]    Allergies Codeine, Abilify [aripiprazole], Morphine and related, Risperdal [risperidone], and Vicodin [hydrocodone-acetaminophen]  History reviewed. No pertinent family history.  Social History Social History   Tobacco Use   Smoking status: Every Day    Packs/day: 0.50    Types: Cigarettes   Smokeless tobacco: Never  Vaping Use   Vaping Use: Never used  Substance Use Topics   Alcohol use: Never   Drug use: Yes    Types: Marijuana    Review of Systems Constitutional: No fever/chills Eyes: No visual changes. ENT: No sore throat. Cardiovascular: Denies chest pain. Respiratory: Denies shortness of breath. Gastrointestinal: Endorses bilateral flank pain.  Endorses nausea and vomiting.  No diarrhea. Genitourinary: Positive for dysuria. Musculoskeletal: Negative for acute arthralgias Skin: Negative for rash. Neurological: Negative for headaches, weakness/numbness/paresthesias in any extremity Psychiatric: Negative for suicidal ideation/homicidal ideation   ____________________________________________   PHYSICAL EXAM:  VITAL SIGNS: ED Triage Vitals  Enc Vitals Group     BP 05/09/21 2304 116/82     Pulse Rate 05/09/21 2304 93     Resp 05/09/21 2304 16     Temp 05/09/21 2304 98.4 F (36.9 C)     Temp Source 05/09/21 2304 Oral     SpO2 05/09/21 2304 95 %  Weight 05/09/21 2305 200 lb (90.7 kg)     Height 05/09/21 2305 5\' 7"  (1.702 m)     Head Circumference --      Peak Flow --      Pain Score 05/09/21 2304 5     Pain Loc --      Pain Edu? --      Excl. in [place]? --    Constitutional: Alert and oriented. Well appearing and in no acute distress. Eyes: Conjunctivae are normal. PERRL. Head: Atraumatic. Nose: No congestion/rhinnorhea. Mouth/Throat: Mucous membranes are moist. Neck: No  stridor Cardiovascular: Grossly normal heart sounds.  Good peripheral circulation. Respiratory: Normal respiratory effort.  No retractions. Gastrointestinal: Soft and nontender. No distention. Musculoskeletal: No obvious deformities Neurologic:  Normal speech and language. No gross focal neurologic deficits are appreciated. Skin:  Skin is warm and dry. No rash noted. Psychiatric: Mood and affect are normal. Speech and behavior are normal.  ____________________________________________   LABS (all labs ordered are listed, but only abnormal results are displayed)  Labs Reviewed  COMPREHENSIVE METABOLIC PANEL - Abnormal; Notable for the following components:      Result Value   Sodium 133 (*)    Glucose, Bld 163 (*)    Total Protein 8.4 (*)    All other components within normal limits  CBC - Abnormal; Notable for the following components:   WBC 19.6 (*)    RBC 5.18 (*)    Hemoglobin 15.4 (*)    Platelets 459 (*)    All other components within normal limits  URINALYSIS, ROUTINE W REFLEX MICROSCOPIC - Abnormal; Notable for the following components:   Color, Urine YELLOW (*)    APPearance CLOUDY (*)    Glucose, UA 50 (*)    Ketones, ur 5 (*)    Protein, ur 100 (*)    Leukocytes,Ua SMALL (*)    Bacteria, UA MANY (*)    All other components within normal limits  LIPASE, BLOOD  POC URINE PREG, ED   ____________________________________________  EKG  ED ECG REPORT I, 05/11/21, the attending physician, personally viewed and interpreted this ECG.  Date: 05/10/2021 EKG Time: 2320 Rate: 93 Rhythm: normal sinus rhythm QRS Axis: normal Intervals: normal ST/T Wave abnormalities: normal Narrative Interpretation: no evidence of acute ischemia   PROCEDURES  Procedure(s) performed (including Critical Care):  Procedures   ____________________________________________   INITIAL IMPRESSION / ASSESSMENT AND PLAN / ED COURSE  As part of my medical decision making, I  reviewed the following data within the electronic medical record, if available:  Nursing notes reviewed and incorporated, Labs reviewed, EKG interpreted, Old chart reviewed, Radiograph reviewed and Notes from prior ED visits reviewed and incorporated        Not Pregnant. Unlikely TOA, Ovarian Torsion, PID, gonorrhea/chlamydia. Low suspicion for Infected Urolithiasis, AAA, Cholecystitis, Pancreatitis, SBO, Appendicitis, or other acute abdomen.  Given flank pain and possibility of a sending infection, will cover with longer course of antibiotics.  Patient's vital signs are stable, afebrile, and appropriate for discharge  Rx: Cefdinir 300 mg BID for 10 days Disposition: Discharge home. SRP discussed. Advise follow up with primary care provider within 24-72 hours.      ____________________________________________   FINAL CLINICAL IMPRESSION(S) / ED DIAGNOSES  Final diagnoses:  Nausea and vomiting, unspecified vomiting type  Urinary tract infection without hematuria, site unspecified     ED Discharge Orders          Ordered    ondansetron (ZOFRAN ODT) 4 MG  disintegrating tablet  Every 8 hours PRN        05/10/21 0945    cefdinir (OMNICEF) 300 MG capsule  2 times daily        05/10/21 0945             Note:  This document was prepared using Dragon voice recognition software and may include unintentional dictation errors.    Merwyn Katos, MD 05/10/21 1017

## 2022-02-09 ENCOUNTER — Other Ambulatory Visit: Payer: Self-pay

## 2022-02-09 ENCOUNTER — Emergency Department: Payer: Medicare Other

## 2022-02-09 ENCOUNTER — Emergency Department
Admission: EM | Admit: 2022-02-09 | Discharge: 2022-02-09 | Disposition: A | Discharge status: Home / Self Care | Payer: Medicare Other | Attending: Emergency Medicine | Admitting: Emergency Medicine

## 2022-02-09 ENCOUNTER — Encounter: Payer: Self-pay | Admitting: Emergency Medicine

## 2022-02-09 DIAGNOSIS — S8991XA Unspecified injury of right lower leg, initial encounter: Secondary | ICD-10-CM | POA: Diagnosis present

## 2022-02-09 DIAGNOSIS — W1839XA Other fall on same level, initial encounter: Secondary | ICD-10-CM | POA: Diagnosis not present

## 2022-02-09 DIAGNOSIS — M25572 Pain in left ankle and joints of left foot: Secondary | ICD-10-CM | POA: Insufficient documentation

## 2022-02-09 DIAGNOSIS — S80211A Abrasion, right knee, initial encounter: Secondary | ICD-10-CM | POA: Insufficient documentation

## 2022-02-09 DIAGNOSIS — Z23 Encounter for immunization: Secondary | ICD-10-CM | POA: Insufficient documentation

## 2022-02-09 DIAGNOSIS — M25472 Effusion, left ankle: Secondary | ICD-10-CM | POA: Insufficient documentation

## 2022-02-09 HISTORY — DX: Schizophrenia, unspecified: F20.9

## 2022-02-09 MED ORDER — ACETAMINOPHEN 325 MG PO TABS
650.0000 mg | ORAL_TABLET | Freq: Once | ORAL | Status: AC
  Administered 2022-02-09: 650 mg via ORAL
  Filled 2022-02-09: qty 2

## 2022-02-09 MED ORDER — TETANUS-DIPHTH-ACELL PERTUSSIS 5-2.5-18.5 LF-MCG/0.5 IM SUSY
0.5000 mL | PREFILLED_SYRINGE | Freq: Once | INTRAMUSCULAR | Status: AC
  Administered 2022-02-09: 0.5 mL via INTRAMUSCULAR
  Filled 2022-02-09: qty 0.5

## 2022-02-09 NOTE — ED Notes (Signed)
See triage note. Presents s/p fall   Stepped in a hole  twisted ankle  good pulses

## 2022-02-09 NOTE — ED Notes (Signed)
Happened 10 pm last night. Pt has adequate ROM,& PMS functions intact. Minimal bruising and + swelling.

## 2022-02-09 NOTE — ED Triage Notes (Signed)
Stepped in hold and injured left ankle.

## 2022-02-09 NOTE — Discharge Instructions (Signed)
Your x-rays were normal.  Rest, ice, elevate your leg.  Please keep your splint on during the day while you are ambulating, and take it off at night.  Please return for any new, worsening, or change in symptoms or other concerns.

## 2022-02-09 NOTE — ED Provider Notes (Signed)
[facility] Provider Note    Event Date/Time   First MD Initiated Contact with Patient 02/09/22 1251     (approximate)   History   Ankle Pain   HPI  Olivia Olson is a 36 y.o. female who presents today for evaluation of left ankle pain.  Patient reports that she stepped in a pothole last night and twisted her left ankle.  She reports that she was able to get himself up without assistance and has been able to bear weight but with pain.  She has noticed swelling to the lateral aspect of her ankle prompting her to come in for evaluation.  She reports that she also scraped her knee during the fall, but has no knee pain now.  She is unsure of her last tetanus shot.  No numbness or tingling.  No head strike or LOC.     Physical Exam   Triage Vital Signs: ED Triage Vitals  Enc Vitals Group     BP 02/09/22 1237 107/64     Pulse Rate 02/09/22 1237 80     Resp 02/09/22 1237 16     Temp 02/09/22 1237 98.1 F (36.7 C)     Temp Source 02/09/22 1237 Oral     SpO2 02/09/22 1237 98 %     Weight 02/09/22 1255 199 lb 15.3 oz (90.7 kg)     Height 02/09/22 1255 5\' 7"  (1.702 m)     Head Circumference --      Peak Flow --      Pain Score 02/09/22 1224 7     Pain Loc --      Pain Edu? --      Excl. in [place]? --     Most recent vital signs: Vitals:   02/09/22 1237  BP: 107/64  Pulse: 80  Resp: 16  Temp: 98.1 F (36.7 C)  SpO2: 98%    Physical Exam Vitals and nursing note reviewed.  Constitutional:      General: Awake and alert. No acute distress.    Appearance: Normal appearance. The patient is obese.  HENT:     Head: Normocephalic and atraumatic.     Mouth: Mucous membranes are moist.  Eyes:     General: PERRL. Normal EOMs        Right eye: No discharge.        Left eye: No discharge.     Conjunctiva/sclera: Conjunctivae normal.  Cardiovascular:     Rate and Rhythm: Normal rate and regular rhythm.     Pulses: Normal pulses.     Heart sounds:  Normal heart sounds Pulmonary:     Effort: Pulmonary effort is normal. No respiratory distress.     Breath sounds: Normal breath sounds.  Abdominal:     Abdomen is soft. There is no abdominal tenderness. No rebound or guarding. No distention. Musculoskeletal:        General: No swelling. Normal range of motion.     Cervical back: Normal range of motion and neck supple.  Left ankle: tenderness and swelling over the anterior talofibular ligament and posterior to lateral malleolus, no lateral or medial malleolar tenderness or proximal fifth metacarpal tenderness. No proximal fibular tenderness. 2+ pedal pulses with brisk capillary refill. Intact distal sensation and strength with normal ROM. Able to plantar flex and dorsiflex against resistance. Able to invert and evert against resistance. Negative  dorsiflexion external rotation test. Negative squeeze test. Negative Thompson test Right knee: Superficial abrasion over anterior knee.  Full and normal range of motion with active and passive range of motion of knee.  No ligamental laxity.  No effusion.  Normal distal pulses Skin:    General: Skin is warm and dry.     Capillary Refill: Capillary refill takes less than 2 seconds.     Findings: No rash.  Abrasion to right knee Neurological:     Mental Status: The patient is awake and alert.      ED Results / Procedures / Treatments   Labs (all labs ordered are listed, but only abnormal results are displayed) Labs Reviewed - No data to display   EKG     RADIOLOGY I independently reviewed and interpreted imaging and agree with radiologists findings.     PROCEDURES:  Critical Care performed:   Procedures   MEDICATIONS ORDERED IN ED: Medications  acetaminophen (TYLENOL) tablet 650 mg (650 mg Oral Given 02/09/22 1314)  Tdap (BOOSTRIX) injection 0.5 mL (0.5 mLs Intramuscular Given 02/09/22 1408)     IMPRESSION / MDM / ASSESSMENT AND PLAN / ED COURSE  I reviewed the triage vital  signs and the nursing notes.   Differential diagnosis includes, but is not limited to, fracture, sprain, ligamental injury, dislocation, contusion.  Patient has swelling and tenderness to palpation over lateral malleolus and ATFL.  Therefore per [place] ankle rules x-ray was obtained.  There is no evidence of fracture on x-ray.  There is no tenderness to proximal fibula that would be concerning for occult fracture.  There is no knee pain or swelling and no ligamental laxity, do not suspect knee injury.  There is no proximal fifth metatarsal tenderness concerning for Jones fracture, xray foot negative.  Negative squeeze test so do not suspect high ankle sprain.  Negative Thompson test, do not suspect Achilles tendon rupture. Patient is able to bear weight with pain.  Patient was given an ankle splint and instructed to wear it while ambulating, and remove it at night.  We discussed [place] and outpatient follow-up.  Patient was treated symptomatically in the emergency department with improvement of symptoms.  She was given an updated tetanus shot given her abrasion to her right knee.  We discussed no sports until ankle heals.  Patient understands and agrees with plan.   Patient's presentation is most consistent with acute complicated illness / injury requiring diagnostic workup.      FINAL CLINICAL IMPRESSION(S) / ED DIAGNOSES   Final diagnoses:  Acute left ankle pain     Rx / DC Orders   ED Discharge Orders     None        Note:  This document was prepared using Dragon voice recognition software and may include unintentional dictation errors.   Keturah Shavers 02/09/22 1812    Minna Antis, MD 02/10/22 Jerene Bears

## 2022-04-09 ENCOUNTER — Emergency Department: Payer: Medicare HMO

## 2022-04-09 ENCOUNTER — Observation Stay
Admission: EM | Admit: 2022-04-09 | Discharge: 2022-04-10 | Disposition: A | Discharge status: Home / Self Care | Payer: Medicare HMO | Attending: Internal Medicine | Admitting: Internal Medicine

## 2022-04-09 ENCOUNTER — Other Ambulatory Visit: Payer: Self-pay

## 2022-04-09 ENCOUNTER — Encounter: Payer: Self-pay | Admitting: Emergency Medicine

## 2022-04-09 DIAGNOSIS — G039 Meningitis, unspecified: Secondary | ICD-10-CM

## 2022-04-09 DIAGNOSIS — E119 Type 2 diabetes mellitus without complications: Secondary | ICD-10-CM | POA: Diagnosis not present

## 2022-04-09 DIAGNOSIS — I959 Hypotension, unspecified: Secondary | ICD-10-CM | POA: Diagnosis not present

## 2022-04-09 DIAGNOSIS — R000 Tachycardia, unspecified: Secondary | ICD-10-CM | POA: Diagnosis not present

## 2022-04-09 DIAGNOSIS — F203 Undifferentiated schizophrenia: Secondary | ICD-10-CM | POA: Diagnosis not present

## 2022-04-09 DIAGNOSIS — E1169 Type 2 diabetes mellitus with other specified complication: Secondary | ICD-10-CM | POA: Insufficient documentation

## 2022-04-09 DIAGNOSIS — F17200 Nicotine dependence, unspecified, uncomplicated: Secondary | ICD-10-CM | POA: Diagnosis present

## 2022-04-09 DIAGNOSIS — R509 Fever, unspecified: Secondary | ICD-10-CM | POA: Diagnosis not present

## 2022-04-09 DIAGNOSIS — Z20822 Contact with and (suspected) exposure to covid-19: Secondary | ICD-10-CM | POA: Insufficient documentation

## 2022-04-09 DIAGNOSIS — D72829 Elevated white blood cell count, unspecified: Secondary | ICD-10-CM

## 2022-04-09 DIAGNOSIS — R112 Nausea with vomiting, unspecified: Secondary | ICD-10-CM | POA: Insufficient documentation

## 2022-04-09 DIAGNOSIS — E781 Pure hyperglyceridemia: Secondary | ICD-10-CM

## 2022-04-09 DIAGNOSIS — R519 Headache, unspecified: Secondary | ICD-10-CM | POA: Diagnosis not present

## 2022-04-09 DIAGNOSIS — F17210 Nicotine dependence, cigarettes, uncomplicated: Secondary | ICD-10-CM | POA: Diagnosis not present

## 2022-04-09 DIAGNOSIS — Z794 Long term (current) use of insulin: Secondary | ICD-10-CM | POA: Diagnosis not present

## 2022-04-09 DIAGNOSIS — Z79899 Other long term (current) drug therapy: Secondary | ICD-10-CM | POA: Insufficient documentation

## 2022-04-09 DIAGNOSIS — G43809 Other migraine, not intractable, without status migrainosus: Secondary | ICD-10-CM | POA: Diagnosis not present

## 2022-04-09 DIAGNOSIS — Z8659 Personal history of other mental and behavioral disorders: Secondary | ICD-10-CM

## 2022-04-09 DIAGNOSIS — R441 Visual hallucinations: Secondary | ICD-10-CM | POA: Insufficient documentation

## 2022-04-09 DIAGNOSIS — Z7984 Long term (current) use of oral hypoglycemic drugs: Secondary | ICD-10-CM | POA: Insufficient documentation

## 2022-04-09 DIAGNOSIS — D72823 Leukemoid reaction: Secondary | ICD-10-CM | POA: Diagnosis not present

## 2022-04-09 DIAGNOSIS — F209 Schizophrenia, unspecified: Secondary | ICD-10-CM | POA: Diagnosis not present

## 2022-04-09 DIAGNOSIS — E669 Obesity, unspecified: Secondary | ICD-10-CM

## 2022-04-09 LAB — COMPREHENSIVE METABOLIC PANEL
ALT: 24 U/L (ref 0–44)
AST: 23 U/L (ref 15–41)
Albumin: 4.9 g/dL (ref 3.5–5.0)
Alkaline Phosphatase: 108 U/L (ref 38–126)
Anion gap: 12 (ref 5–15)
BUN: 11 mg/dL (ref 6–20)
CO2: 20 mmol/L — ABNORMAL LOW (ref 22–32)
Calcium: 10 mg/dL (ref 8.9–10.3)
Chloride: 103 mmol/L (ref 98–111)
Creatinine, Ser: 0.64 mg/dL (ref 0.44–1.00)
GFR, Estimated: >60 mL/min (ref >60)
Glucose, Bld: 224 mg/dL — ABNORMAL HIGH (ref 70–99)
Potassium: 3.9 mmol/L (ref 3.5–5.1)
Sodium: 135 mmol/L (ref 135–145)
Total Bilirubin: 0.7 mg/dL (ref 0.3–1.2)
Total Protein: 9 g/dL — ABNORMAL HIGH (ref 6.5–8.1)

## 2022-04-09 LAB — URINE DRUG SCREEN, QUALITATIVE (ARMC ONLY)
Amphetamines, Ur Screen: NOT DETECTED
Barbiturates, Ur Screen: NOT DETECTED
Benzodiazepine, Ur Scrn: NOT DETECTED
Cannabinoid 50 Ng, Ur ~~LOC~~: POSITIVE — AB
Cocaine Metabolite,Ur ~~LOC~~: NOT DETECTED
MDMA (Ecstasy)Ur Screen: NOT DETECTED
Methadone Scn, Ur: NOT DETECTED
Opiate, Ur Screen: NOT DETECTED
Phencyclidine (PCP) Ur S: NOT DETECTED
Tricyclic, Ur Screen: NOT DETECTED

## 2022-04-09 LAB — CSF CELL COUNT WITH DIFFERENTIAL
Eosinophils, CSF: 0 %
Eosinophils, CSF: 0 %
Lymphs, CSF: 76 %
Lymphs, CSF: 100 %
Monocyte-Macrophage-Spinal Fluid: 14 %
Monocyte-Macrophage-Spinal Fluid: 0 %
RBC Count, CSF: 9 /mm3 — ABNORMAL HIGH (ref 0–3)
RBC Count, CSF: 7 /mm3 — ABNORMAL HIGH (ref 0–3)
Segmented Neutrophils-CSF: 10 %
Segmented Neutrophils-CSF: 0 %
Tube #: 3
Tube #: 2
WBC, CSF: 5 /mm3 (ref 0–5)
WBC, CSF: 2 /mm3 (ref 0–5)

## 2022-04-09 LAB — URINALYSIS, ROUTINE W REFLEX MICROSCOPIC
Bacteria, UA: NONE SEEN
Bilirubin Urine: NEGATIVE
Glucose, UA: >=500 mg/dL — AB
Hgb urine dipstick: NEGATIVE
Ketones, ur: 20 mg/dL — AB
Leukocytes,Ua: NEGATIVE
Nitrite: NEGATIVE
Protein, ur: 100 mg/dL — AB
Specific Gravity, Urine: 1.021 (ref 1.005–1.030)
pH: 6 (ref 5.0–8.0)

## 2022-04-09 LAB — MENINGITIS/ENCEPHALITIS PANEL (CSF)

## 2022-04-09 LAB — LACTIC ACID, PLASMA
Lactic Acid, Venous: 1.1 mmol/L (ref 0.5–1.9)
Lactic Acid, Venous: 1 mmol/L (ref 0.5–1.9)

## 2022-04-09 LAB — BASIC METABOLIC PANEL
Anion gap: 15 (ref 5–15)
BUN: 10 mg/dL (ref 6–20)
CO2: 21 mmol/L — ABNORMAL LOW (ref 22–32)
Calcium: 9.6 mg/dL (ref 8.9–10.3)
Chloride: 103 mmol/L (ref 98–111)
Creatinine, Ser: 0.5 mg/dL (ref 0.44–1.00)
GFR, Estimated: >60 mL/min (ref >60)
Glucose, Bld: 175 mg/dL — ABNORMAL HIGH (ref 70–99)
Potassium: 4.4 mmol/L (ref 3.5–5.1)
Sodium: 139 mmol/L (ref 135–145)

## 2022-04-09 LAB — CBC WITH DIFFERENTIAL/PLATELET
Abs Immature Granulocytes: 0.24 10*3/uL — ABNORMAL HIGH (ref 0.00–0.07)
Basophils Absolute: 0.1 10*3/uL (ref 0.0–0.1)
Basophils Relative: 1 %
Eosinophils Absolute: 0.4 10*3/uL (ref 0.0–0.5)
Eosinophils Relative: 2 %
HCT: 49.1 % — ABNORMAL HIGH (ref 36.0–46.0)
Hemoglobin: 16.2 g/dL — ABNORMAL HIGH (ref 12.0–15.0)
Immature Granulocytes: 1 %
Lymphocytes Relative: 19 %
Lymphs Abs: 4.1 10*3/uL — ABNORMAL HIGH (ref 0.7–4.0)
MCH: 27.8 pg (ref 26.0–34.0)
MCHC: 33 g/dL (ref 30.0–36.0)
MCV: 84.4 fL (ref 80.0–100.0)
Monocytes Absolute: 1.2 10*3/uL — ABNORMAL HIGH (ref 0.1–1.0)
Monocytes Relative: 6 %
Neutro Abs: 15.5 10*3/uL — ABNORMAL HIGH (ref 1.7–7.7)
Neutrophils Relative %: 71 %
Platelets: 528 10*3/uL — ABNORMAL HIGH (ref 150–400)
RBC: 5.82 MIL/uL — ABNORMAL HIGH (ref 3.87–5.11)
RDW: 14 % (ref 11.5–15.5)
WBC: 21.6 10*3/uL — ABNORMAL HIGH (ref 4.0–10.5)
nRBC: 0 % (ref 0.0–0.2)

## 2022-04-09 LAB — POC URINE PREG, ED

## 2022-04-09 LAB — CBG MONITORING, ED
Glucose-Capillary: 236 mg/dL — ABNORMAL HIGH (ref 70–99)
Glucose-Capillary: 214 mg/dL — ABNORMAL HIGH (ref 70–99)
Glucose-Capillary: 265 mg/dL — ABNORMAL HIGH (ref 70–99)

## 2022-04-09 LAB — BETA-HYDROXYBUTYRIC ACID: Beta-Hydroxybutyric Acid: 0.75 mmol/L — ABNORMAL HIGH (ref 0.05–0.27)

## 2022-04-09 LAB — RESP PANEL BY RT-PCR (FLU A&B, COVID) ARPGX2
Influenza A by PCR: NEGATIVE
Influenza B by PCR: NEGATIVE
SARS Coronavirus 2 by RT PCR: NEGATIVE

## 2022-04-09 LAB — PROCALCITONIN: Procalcitonin: <0.1 ng/mL

## 2022-04-09 LAB — BLOOD GAS, VENOUS
Acid-Base Excess: 1.7 mmol/L (ref 0.0–2.0)
Bicarbonate: 26.6 mmol/L (ref 20.0–28.0)
O2 Saturation: 77.1 %
Patient temperature: 37
pCO2, Ven: 42 mmHg — ABNORMAL LOW (ref 44–60)
pH, Ven: 7.41 (ref 7.25–7.43)
pO2, Ven: 46 mmHg — ABNORMAL HIGH (ref 32–45)

## 2022-04-09 LAB — PROTEIN AND GLUCOSE, CSF
Glucose, CSF: 119 mg/dL — ABNORMAL HIGH (ref 40–70)
Total  Protein, CSF: 59 mg/dL — ABNORMAL HIGH (ref 15–45)

## 2022-04-09 LAB — LIPASE, BLOOD: Lipase: 27 U/L (ref 11–51)

## 2022-04-09 LAB — GLUCOSE, CAPILLARY: Glucose-Capillary: 183 mg/dL — ABNORMAL HIGH (ref 70–99)

## 2022-04-09 MED ORDER — INSULIN ASPART 100 UNIT/ML IJ SOLN
5.0000 [IU] | Freq: Once | INTRAMUSCULAR | Status: AC
  Administered 2022-04-09: 5 [IU] via INTRAVENOUS
  Filled 2022-04-09: qty 1

## 2022-04-09 MED ORDER — SODIUM CHLORIDE 0.9 % IV BOLUS
500.0000 mL | Freq: Once | INTRAVENOUS | Status: AC
  Administered 2022-04-09: 500 mL via INTRAVENOUS

## 2022-04-09 MED ORDER — DEXAMETHASONE SODIUM PHOSPHATE 10 MG/ML IJ SOLN
10.0000 mg | Freq: Once | INTRAMUSCULAR | Status: AC
  Administered 2022-04-09: 10 mg via INTRAVENOUS
  Filled 2022-04-09: qty 1

## 2022-04-09 MED ORDER — ONDANSETRON HCL 4 MG PO TABS
4.0000 mg | ORAL_TABLET | Freq: Four times a day (QID) | ORAL | Status: DC | PRN
  Administered 2022-04-09: 4 mg via ORAL
  Filled 2022-04-09: qty 1

## 2022-04-09 MED ORDER — SERTRALINE HCL 50 MG PO TABS
100.0000 mg | ORAL_TABLET | Freq: Two times a day (BID) | ORAL | Status: DC
  Administered 2022-04-10: 100 mg via ORAL
  Filled 2022-04-09: qty 2

## 2022-04-09 MED ORDER — HALOPERIDOL LACTATE 5 MG/ML IJ SOLN
2.5000 mg | Freq: Once | INTRAMUSCULAR | Status: AC
  Administered 2022-04-09: 2.5 mg via INTRAVENOUS
  Filled 2022-04-09: qty 1

## 2022-04-09 MED ORDER — ZIPRASIDONE HCL 40 MG PO CAPS
60.0000 mg | ORAL_CAPSULE | Freq: Two times a day (BID) | ORAL | Status: DC
  Administered 2022-04-10: 60 mg via ORAL
  Filled 2022-04-09 (×2): qty 1

## 2022-04-09 MED ORDER — VANCOMYCIN HCL 1750 MG/350ML IV SOLN
1750.0000 mg | Freq: Once | INTRAVENOUS | Status: AC
  Administered 2022-04-09: 1750 mg via INTRAVENOUS
  Filled 2022-04-09: qty 350

## 2022-04-09 MED ORDER — LISINOPRIL 5 MG PO TABS: 2.5000 mg | ORAL_TABLET | Freq: Every day | ORAL | Status: DC

## 2022-04-09 MED ORDER — LACTATED RINGERS IV BOLUS
1000.0000 mL | Freq: Once | INTRAVENOUS | Status: AC
  Administered 2022-04-09: 1000 mL via INTRAVENOUS

## 2022-04-09 MED ORDER — GABAPENTIN 100 MG PO CAPS
100.0000 mg | ORAL_CAPSULE | Freq: Three times a day (TID) | ORAL | Status: DC
  Administered 2022-04-09 – 2022-04-10 (×2): 100 mg via ORAL
  Filled 2022-04-09 (×2): qty 1

## 2022-04-09 MED ORDER — DIPHENHYDRAMINE HCL 50 MG/ML IJ SOLN
12.5000 mg | INTRAMUSCULAR | Status: AC
  Administered 2022-04-09: 12.5 mg via INTRAVENOUS
  Filled 2022-04-09: qty 1

## 2022-04-09 MED ORDER — ALBUTEROL SULFATE (2.5 MG/3ML) 0.083% IN NEBU: 3.0000 mL | INHALATION_SOLUTION | RESPIRATORY_TRACT | Status: DC | PRN

## 2022-04-09 MED ORDER — INSULIN DETEMIR 100 UNIT/ML ~~LOC~~ SOLN
25.0000 [IU] | Freq: Every day | SUBCUTANEOUS | Status: DC
  Administered 2022-04-09: 25 [IU] via SUBCUTANEOUS
  Filled 2022-04-09 (×2): qty 0.25

## 2022-04-09 MED ORDER — SODIUM CHLORIDE 0.9 % IV SOLN
2.0000 g | Freq: Once | INTRAVENOUS | Status: AC
  Administered 2022-04-09: 2 g via INTRAVENOUS
  Filled 2022-04-09: qty 20

## 2022-04-09 MED ORDER — NICOTINE 14 MG/24HR TD PT24
14.0000 mg | MEDICATED_PATCH | Freq: Every day | TRANSDERMAL | Status: DC
  Administered 2022-04-09 – 2022-04-10 (×2): 14 mg via TRANSDERMAL
  Filled 2022-04-09 (×2): qty 1

## 2022-04-09 MED ORDER — INSULIN ASPART 100 UNIT/ML IJ SOLN
0.0000 [IU] | Freq: Three times a day (TID) | INTRAMUSCULAR | Status: DC
  Administered 2022-04-09: 8 [IU] via SUBCUTANEOUS
  Administered 2022-04-10: 3 [IU] via SUBCUTANEOUS
  Filled 2022-04-09 (×2): qty 1

## 2022-04-09 MED ORDER — QUETIAPINE FUMARATE 25 MG PO TABS: 50.0000 mg | ORAL_TABLET | Freq: Every day | ORAL | Status: DC

## 2022-04-09 MED ORDER — GEMFIBROZIL 600 MG PO TABS
600.0000 mg | ORAL_TABLET | Freq: Two times a day (BID) | ORAL | Status: DC
  Administered 2022-04-10: 600 mg via ORAL
  Filled 2022-04-09: qty 1

## 2022-04-09 MED ORDER — DROPERIDOL 2.5 MG/ML IJ SOLN
2.5000 mg | Freq: Once | INTRAMUSCULAR | Status: AC
  Administered 2022-04-09: 2.5 mg via INTRAVENOUS
  Filled 2022-04-09: qty 2

## 2022-04-09 MED ORDER — LACTATED RINGERS IV SOLN: INTRAVENOUS | Status: DC

## 2022-04-09 MED ORDER — ENOXAPARIN SODIUM 40 MG/0.4ML IJ SOSY
40.0000 mg | PREFILLED_SYRINGE | INTRAMUSCULAR | Status: DC
  Administered 2022-04-09: 40 mg via SUBCUTANEOUS
  Filled 2022-04-09: qty 0.4

## 2022-04-09 MED ORDER — EZETIMIBE 10 MG PO TABS
10.0000 mg | ORAL_TABLET | Freq: Every day | ORAL | Status: DC
  Administered 2022-04-10: 10 mg via ORAL
  Filled 2022-04-09: qty 1

## 2022-04-09 MED ORDER — BUPROPION HCL ER (SR) 100 MG PO TB12
100.0000 mg | ORAL_TABLET | Freq: Two times a day (BID) | ORAL | Status: DC
  Administered 2022-04-10: 100 mg via ORAL
  Filled 2022-04-09: qty 1

## 2022-04-09 MED ORDER — ONDANSETRON HCL 4 MG/2ML IJ SOLN: 4.0000 mg | Freq: Four times a day (QID) | INTRAMUSCULAR | Status: DC | PRN

## 2022-04-09 NOTE — H&P (Signed)
History and Physical    Patient: Olivia Olson:[id number] DOB: 12/22/1985 DOA: 04/09/2022 DOS: the patient was seen and examined on 04/09/2022 PCP: System, Provider Not In  Patient coming from: Home  Chief Complaint:  Chief Complaint  Patient presents with   Migraine   HPI: Olivia Olson is a 36 y.o. female with medical history significant for insulin-dependent diabetes mellitus, schizophrenia who presents to the ER for evaluation of nausea, vomiting and a headache. Patient states that she was in her usual state of health until 1 day prior to her admission when she developed a generalized headache associated with nausea, dry heaves and vomiting.  She also complains of chills and fever which was undocumented and was noted to be diaphoretic when she arrived to ER. She complains of neck pain and denies having any sick contacts. She denies having any chest pain, shortness of breath, no abdominal pain, no changes in her bowel habits, no urinary symptoms, no blurred vision, no leg swelling, no focal deficit. She had a CT scan of the head done without contrast which did not show any acute findings. Spinal tap was done in the ER and CSF had increased glucose as well as increased total protein. Patient received empiric antibiotic therapy with vancomycin and Rocephin as well as a dose of dexamethasone and admission has been requested for further evaluation.   Review of Systems: As mentioned in the history of present illness. All other systems reviewed and are negative. Past Medical History:  Diagnosis Date   Diabetes mellitus without complication (HCC)    Migraines    Schizophrenia (HCC)    Past Surgical History:  Procedure Laterality Date   CESAREAN SECTION     GANGLION CYST EXCISION Left    LAPAROSCOPIC OOPHERECTOMY     Social History:  reports that she has been smoking cigarettes. She has been smoking an average of .5 packs per day. She has never used smokeless tobacco. She  reports current drug use. Drug: Marijuana. She reports that she does not drink alcohol.  Allergies  Allergen Reactions   Codeine Hives and Rash   Abilify [Aripiprazole] Other (See Comments)    Agitation   Morphine And Related Rash   Risperdal [Risperidone] Hives and Rash   Vicodin [Hydrocodone-Acetaminophen] Rash    No family history on file.  Prior to Admission medications   Medication Sig Start Date End Date Taking? Authorizing Provider  buPROPion (WELLBUTRIN SR) 100 MG 12 hr tablet Take 100 mg by mouth 2 (two) times daily.    [provider]  ezetimibe (ZETIA) 10 MG tablet Take 10 mg by mouth daily. 11/06/21   [provider]  fluticasone (FLONASE) 50 MCG/ACT nasal spray Place 1 spray into both nostrils daily. 11/07/21   [provider]  gabapentin (NEURONTIN) 100 MG capsule Take 100 mg by mouth 3 (three) times daily.    [provider]  gemfibrozil (LOPID) 600 MG tablet Take 600 mg by mouth 2 (two) times daily. 02/24/22   [provider]  glipiZIDE (GLUCOTROL XL) 10 MG 24 hr tablet Take 10 mg by mouth daily. 11/06/21   [provider]  insulin detemir (LEVEMIR) 100 UNIT/ML FlexPen Inject 38 Units into the skin at bedtime. 11/06/21   [provider]  JARDIANCE 10 MG TABS tablet Take 10 mg by mouth daily. 11/07/21   [provider]  lisinopril (ZESTRIL) 2.5 MG tablet Take 2.5 mg by mouth daily. 12/19/21   [provider]  metFORMIN (GLUCOPHAGE) 1000  MG tablet Take 1,000 mg by mouth 2 (two) times daily.    [provider]  ondansetron (ZOFRAN ODT) 4 MG disintegrating tablet Take 1 tablet (4 mg total) by mouth every 8 (eight) hours as needed for nausea or vomiting. 10/12/18   Earleen Newport, MD  ondansetron (ZOFRAN ODT) 4 MG disintegrating tablet Take 1 tablet (4 mg total) by mouth every 8 (eight) hours as needed for nausea or vomiting. 05/10/21   Naaman Plummer, MD  QUEtiapine (SEROQUEL) 50 MG tablet  Take 50 mg by mouth daily. 03/20/22   [provider]  sertraline (ZOLOFT) 100 MG tablet Take 100 mg by mouth 2 (two) times daily.    [provider]  VENTOLIN HFA 108 (90 Base) MCG/ACT inhaler Inhale 2 puffs into the lungs every 4 (four) hours as needed. 11/07/21   [provider]  ziprasidone (GEODON) 60 MG capsule Take 60 mg by mouth 2 (two) times daily. 03/20/22   [provider]    Physical Exam: Vitals:   04/09/22 1010 04/09/22 1130 04/09/22 1130 04/09/22 1200  BP:  106/65  122/88  Pulse: (!) 109 87  (!) 107  Resp:  20    Temp: (!) 97.2 F (36.2 C)  98.4 F (36.9 C)   TempSrc: Rectal  Oral   SpO2: 98% 99%  97%   Physical Exam Vitals and nursing note reviewed.  Constitutional:      Appearance: She is obese.     Comments: Acutely ill-appearing  HENT:     Head: Normocephalic and atraumatic.     Nose: Nose normal.     Mouth/Throat:     Mouth: Mucous membranes are moist.  Eyes:     Conjunctiva/sclera: Conjunctivae normal.  Cardiovascular:     Rate and Rhythm: Tachycardia present.  Pulmonary:     Effort: Pulmonary effort is normal.     Breath sounds: Normal breath sounds.  Abdominal:     General: Abdomen is flat. Bowel sounds are normal.     Palpations: Abdomen is soft.  Musculoskeletal:        General: Normal range of motion.     Cervical back: Normal range of motion and neck supple.  Skin:    General: Skin is warm and dry.  Neurological:     General: No focal deficit present.  Psychiatric:        Mood and Affect: Mood normal.        Behavior: Behavior normal.     Data Reviewed: Relevant notes from primary care and specialist visits, past discharge summaries as available in EHR, including Care Everywhere. Prior diagnostic testing as pertinent to current admission diagnoses Updated medications and problem lists for reconciliation ED course, including vitals, labs, imaging, treatment and response to treatment Triage notes, nursing  and pharmacy notes and ED provider's notes Notable results as noted in HPI Labs reviewed.  Procalcitonin less than 0.10, CSF is clear with 5 WBCs, CSF glucose 119, CSF total protein 59, lactic acid 1.0, sodium 139, potassium 4.4, chloride 103, bicarb 21, glucose 175, BUN 10, creatinine 0.50, calcium 9.6, beta hydroxy 0.75, white count 21.6, hemoglobin 16.2, hematocrit 49.1, platelet count 528 Urine drug screen is positive for cannabinoids Respiratory viral panel is negative CT scan of the head without contrast shows no acute findings Chest x-ray reviewed by me shows no evidence of acute cardiopulmonary disease There are no new results to review at this time.  Assessment and Plan: * Meningitis Patient presents for evaluation of  sudden onset headache associated with nausea, dry heaves, vomiting and chills. Noted to have a white count of 21,000 but chart review shows chronically elevated white cell count Had a spinal tap done in the ER and CSF had elevated glucose and protein Patient received empiric antibiotic therapy with Rocephin and vancomycin as well as a dose of dexamethasone. Discussed with infectious disease who recommends to obtain a meningitis and encephalitis panel for now. Further treatment depends on results of further testing. ID has been consulted  Leukemoid reaction Patient with chronically elevated white cell count most likely secondary to leukemoid reaction. Monitor closely during this hospitalization  Nicotine dependence Smoking cessation has been discussed with patient in detail Please patient on a nicotine transdermal patch 14 mg daily  Diabetes mellitus without complication (HCC) We will start patient on clear liquid diet and advance as tolerated Hold oral hypoglycemic agents Hold long-acting insulin but decrease dose due to decreased oral intake Sliding scale with Accu-Cheks before meals and at bedtime  Schizophrenia (HCC) Continue Zoloft, Seroquel, Geodon and  bupropion      Advance Care Planning:   Code Status: Full Code   Consults: Infectious disease  Family Communication: Greater than 50% of time was spent discussing patient's condition and plan of care with her at the bedside.  All questions and concerns have been addressed.  She verbalizes understanding and agrees with the plan.  Severity of Illness: The appropriate patient status for this patient is INPATIENT. Inpatient status is judged to be reasonable and necessary in order to provide the required intensity of service to ensure the patient's safety. The patient's presenting symptoms, physical exam findings, and initial radiographic and laboratory data in the context of their chronic comorbidities is felt to place them at high risk for further clinical deterioration. Furthermore, it is not anticipated that the patient will be medically stable for discharge from the hospital within 2 midnights of admission.   * I certify that at the point of admission it is my clinical judgment that the patient will require inpatient hospital care spanning beyond 2 midnights from the point of admission due to high intensity of service, high risk for further deterioration and high frequency of surveillance required.*  Author: Lucile Shutters, MD 04/09/2022 3:25 PM  For on call review [email or web address].

## 2022-04-09 NOTE — Assessment & Plan Note (Deleted)
We will start patient on clear liquid diet and advance as tolerated Hold oral hypoglycemic agents Hold long-acting insulin but decrease dose due to decreased oral intake Sliding scale with Accu-Cheks before meals and at bedtime

## 2022-04-09 NOTE — Assessment & Plan Note (Deleted)
Smoking cessation has been discussed with patient in detail Please patient on a nicotine transdermal patch 14 mg daily

## 2022-04-09 NOTE — Consult Note (Signed)
PHARMACY -  BRIEF ANTIBIOTIC NOTE   Pharmacy has received consult(s) for vancomycin from an ED provider.  The patient's profile has been reviewed for ht/wt/allergies/indication/available labs.    One time order(s) placed for: Vancomycin 1.75g IV x1 in ED Also receiving ceftriaxone 2g IV x1 ED  Further antibiotics/pharmacy consults should be ordered by admitting physician if indicated.                       Thank you, Lorna Dibble 04/09/2022  1:48 PM

## 2022-04-09 NOTE — Progress Notes (Signed)
Dc airborne and contact precautions per Dr Delaine Lame

## 2022-04-09 NOTE — ED Triage Notes (Signed)
Pt to triage via w/c, dry heaving, diaphoretic; c/o generalized HA accomp by N/V since last night; denies hx of migraines, denies any falls or injuries; pt st last time she had these symptoms she had a bladder infection but pt denies any other symptoms of such

## 2022-04-09 NOTE — Consult Note (Addendum)
NAME: Olivia Olson  DOB: 01/01/1986  MRN: [id number]  Date/Time: 04/09/2022 8:11 PM  REQUESTING PROVIDER: Dr.Agbata Subjective:  REASON FOR CONSULT: Meninigits ?History from mom, and some from patient Olivia Olson is a 36 y.o. with a history of schizophrenia, insulin dependent DM, migraine Presents with nausea /vomiting of 2 days , headache and tremors spasms for 1 day As per mom , she has been hearing voices the past 3 days and has been talking to them She was other wise okay till yesterday- Mom gives her the meds She says she did not miss any dose or change any treatment recently She started having nausea yesterday She was not eating well This morning she woke up with headache more on the occipital area, she also feel she has been having tremors and spasms of her body No fever, no runny nose, no cough, no sob, no chest pain Says she has some trouble passing urine- pushing more No diarrhea Recently completed her menses- no tampon use  02/09/22  BP 107/64  Temp 98.1 F (36.7 C)  Pulse Rate 80  Resp 16  SpO2 98 %    Latest Reference Range & Units 04/09/22  WBC 4.0 - 10.5 K/uL 21.6 (H)  Hemoglobin 12.0 - 15.0 g/dL 16.2 (H)  HCT 36.0 - 46.0 % 49.1 (H)  Platelets 150 - 400 K/uL 528 (H)  Creatinine 0.44 - 1.00 mg/dL 0.50   She had LP  Latest Reference Range & Units 04/09/22 11:55 04/09/22 11:56  Appearance, CSF CLEAR  COLORLESS ! CLEAR !  Glucose, CSF 40 - 70 mg/dL  119 (H)  RBC Count, CSF 0 - 3 /cu mm 7 (H) 9 (H)  WBC, CSF 0 - 5 /cu mm 2 5  Segmented Neutrophils-CSF % 0 10  Lymphs, CSF % 100 76  Monocyte-Macrophage-Spinal Fluid % 0 14  Eosinophils, CSF % 0 0  Color, CSF COLORLESS  CLEAR ! COLORLESS  Supernatant  CLEAR CLEAR  Total  Protein, CSF 15 - 45 mg/dL  59 (H)  Tube #  2 3 (C)    Past Medical History:  Diagnosis Date   Diabetes mellitus without complication (HCC)    Migraines    Schizophrenia (HCC)     Past Surgical History:  Procedure Laterality  Date   CESAREAN SECTION     GANGLION CYST EXCISION Left    LAPAROSCOPIC OOPHERECTOMY      Social History   Socioeconomic History   Marital status: Single    Spouse name: Not on file   Number of children: Not on file   Years of education: Not on file   Highest education level: Not on file  Occupational History   Not on file  Tobacco Use   Smoking status: Every Day    Packs/day: 0.50    Types: Cigarettes   Smokeless tobacco: Never  Vaping Use   Vaping Use: Never used  Substance and Sexual Activity   Alcohol use: Never   Drug use: Yes    Types: Marijuana   Sexual activity: Not on file  Other Topics Concern   Not on file  Social History Narrative   Not on file   Social Determinants of Health   Financial Resource Strain: Not on file  Food Insecurity: Not on file  Transportation Needs: Not on file  Physical Activity: Not on file  Stress: Not on file  Social Connections: Not on file  Intimate Partner Violence: Not on file    Family History Diabetes mom  and many family members   Allergies  Allergen Reactions   Codeine Hives and Rash   Abilify [Aripiprazole] Other (See Comments)    Agitation   Morphine And Related Rash   Risperdal [Risperidone] Hives and Rash   Vicodin [Hydrocodone-Acetaminophen] Rash   Home meds Bupropion Gabapentin Quetipaine Sertraline Ziprasidone Metformin Insulin Jardiance Glipizide Zetia Gemfibrozil   Current Facility-Administered Medications  Medication Dose Route Frequency Provider Last Rate Last Admin   albuterol (PROVENTIL) (2.5 MG/3ML) 0.083% nebulizer solution 3 mL  3 mL Nebulization Q4H PRN Agbata, Tochukwu, MD       [START ON 04/10/2022] buPROPion ER (WELLBUTRIN SR) 12 hr tablet 100 mg  100 mg Oral BID Agbata, Tochukwu, MD       enoxaparin (LOVENOX) injection 40 mg  40 mg Subcutaneous Q24H Agbata, Tochukwu, MD       [START ON 04/10/2022] ezetimibe (ZETIA) tablet 10 mg  10 mg Oral Daily Agbata, Tochukwu, MD       gabapentin  (NEURONTIN) capsule 100 mg  100 mg Oral TID Agbata, Tochukwu, MD       [START ON 04/10/2022] gemfibrozil (LOPID) tablet 600 mg  600 mg Oral BID Agbata, Tochukwu, MD       insulin aspart (novoLOG) injection 0-15 Units  0-15 Units Subcutaneous TID WC Agbata, Tochukwu, MD   8 Units at 04/09/22 1731   insulin detemir (LEVEMIR) injection 25 Units  25 Units Subcutaneous QHS Agbata, Tochukwu, MD       lactated ringers infusion   Intravenous Continuous Agbata, Tochukwu, MD 100 mL/hr at 04/09/22 1733 New Bag at 04/09/22 1733   nicotine (NICODERM CQ - dosed in mg/24 hours) patch 14 mg  14 mg Transdermal Daily Agbata, Tochukwu, MD   14 mg at 04/09/22 1732   ondansetron (ZOFRAN) tablet 4 mg  4 mg Oral Q6H PRN Agbata, Tochukwu, MD       Or   ondansetron (ZOFRAN) injection 4 mg  4 mg Intravenous Q6H PRN Agbata, Tochukwu, MD       [START ON 04/10/2022] QUEtiapine (SEROQUEL) tablet 50 mg  50 mg Oral QHS Agbata, Tochukwu, MD       [START ON 04/10/2022] sertraline (ZOLOFT) tablet 100 mg  100 mg Oral BID Agbata, Tochukwu, MD       [START ON 04/10/2022] ziprasidone (GEODON) capsule 60 mg  60 mg Oral BID WC Agbata, Tochukwu, MD         Abtx:  Anti-infectives (From admission, onward)    Start     Dose/Rate Route Frequency Ordered Stop   04/09/22 1400  vancomycin (VANCOREADY) IVPB 1750 mg/350 mL        1,750 mg 175 mL/hr over 120 Minutes Intravenous  Once 04/09/22 1345 04/09/22 1722   04/09/22 1345  cefTRIAXone (ROCEPHIN) 2 g in sodium chloride 0.9 % 100 mL IVPB        2 g 200 mL/hr over 30 Minutes Intravenous  Once 04/09/22 1336 04/09/22 1503       REVIEW OF SYSTEMS:  Const: negative fever, negative chills, negative weight loss Eyes: negative diplopia or visual changes, negative eye pain ENT: negative coryza, negative sore throat Resp: negative cough, hemoptysis, dyspnea Cards: negative for chest pain, palpitations, lower extremity edema GU: negative for frequency, dysuria and hematuria, but has some trouble  passing urine GI: Negative for abdominal pain, diarrhea, bleeding, constipation Skin: negative for rash and pruritus Heme: negative for easy bruising and gum/nose bleeding MS: tremors, spasms Neurolo: headaches,  Psych: auditory hallucinations Endocrine: , diabetes  Allergy/Immunology- as above Objective:  VITALS:  BP 116/75 (BP Location: Left Arm)   Pulse 92   Temp 99.4 F (37.4 C)   Resp 17   LMP 04/06/2022 (Exact Date)   SpO2 94%   PHYSICAL EXAM:  General: tired, sleepy, sweaty But she wakes up easily, oriented X 5  Head: Normocephalic, without obvious abnormality, atraumatic. Eyes: Conjunctivae clear, anicteric sclerae. Pupils are equal ENT Nares normal. No drainage or sinus tenderness. Lips, mucosa, and tongue normal. No Thrush Neck: Supple, symmetrical, no adenopathy, thyroid: non tender no carotid bruit and no JVD. Back: No CVA tenderness. Lungs: b/l air entry- few rhonchi Heart: Regular rate and rhythm, no murmur, rub or gallop. Abdomen: Soft,minimal lower abdominal tenderness Extremities: atraumatic, no cyanosis. No edema. No clubbing Skin: No rashes or lesions. Or bruising Lymph: Cervical, supraclavicular normal. Neurologic: Grossly non-focal Pertinent Labs Lab Results CBC    Component Value Date/Time   WBC 21.6 (H) 04/09/2022 0627   RBC 5.82 (H) 04/09/2022 0627   HGB 16.2 (H) 04/09/2022 0627   HGB 13.5 10/18/2013 1014   HCT 49.1 (H) 04/09/2022 0627   HCT 40.6 10/18/2013 1014   PLT 528 (H) 04/09/2022 0627   PLT 400 10/18/2013 1014   MCV 84.4 04/09/2022 0627   MCV 90 10/18/2013 1014   MCH 27.8 04/09/2022 0627   MCHC 33.0 04/09/2022 0627   RDW 14.0 04/09/2022 0627   RDW 13.5 10/18/2013 1014   LYMPHSABS 4.1 (H) 04/09/2022 0627   LYMPHSABS 4.4 (H) 10/18/2013 1014   MONOABS 1.2 (H) 04/09/2022 0627   MONOABS 0.9 10/18/2013 1014   EOSABS 0.4 04/09/2022 0627   EOSABS 0.6 10/18/2013 1014   BASOSABS 0.1 04/09/2022 0627   BASOSABS 0.1 10/18/2013 1014        Latest Ref Rng & Units 04/09/2022   12:25 PM 04/09/2022    6:27 AM 05/09/2021   11:02 PM  CMP  Glucose 70 - 99 mg/dL 175  224  163   BUN 6 - 20 mg/dL 10  11  12    Creatinine 0.44 - 1.00 mg/dL 0.50  0.64  0.61   Sodium 135 - 145 mmol/L 139  135  133   Potassium 3.5 - 5.1 mmol/L 4.4  3.9  3.5   Chloride 98 - 111 mmol/L 103  103  99   CO2 22 - 32 mmol/L 21  20  24    Calcium 8.9 - 10.3 mg/dL 9.6  10.0  9.8   Total Protein 6.5 - 8.1 g/dL  9.0  8.4   Total Bilirubin 0.3 - 1.2 mg/dL  0.7  0.5   Alkaline Phos 38 - 126 U/L  108  92   AST 15 - 41 U/L  23  16   ALT 0 - 44 U/L  24  24       Microbiology: Recent Results (from the past 240 hour(s))  Resp Panel by RT-PCR (Flu A&B, Covid) Anterior Nasal Swab     Status: None   Collection Time: 04/09/22 10:39 AM   Specimen: Anterior Nasal Swab  Result Value Ref Range Status   SARS Coronavirus 2 by RT PCR NEGATIVE NEGATIVE Final    Comment: (NOTE) SARS-CoV-2 target nucleic acids are NOT DETECTED.  The SARS-CoV-2 RNA is generally detectable in upper respiratory specimens during the acute phase of infection. The lowest concentration of SARS-CoV-2 viral copies this assay can detect is 138 copies/mL. A negative result does not preclude SARS-Cov-2 infection and should not be used as the sole  basis for treatment or other patient management decisions. A negative result may occur with  improper specimen collection/handling, submission of specimen other than nasopharyngeal swab, presence of viral mutation(s) within the areas targeted by this assay, and inadequate number of viral copies(<138 copies/mL). A negative result must be combined with clinical observations, patient history, and epidemiological information. The expected result is Negative.  Fact Sheet for Patients:  EntrepreneurPulse.com.au  Fact Sheet for Healthcare Providers:  IncredibleEmployment.be  This test is no t yet approved or cleared  by the [place] FDA and  has been authorized for detection and/or diagnosis of SARS-CoV-2 by FDA under an Emergency Use Authorization (EUA). This EUA will remain  in effect (meaning this test can be used) for the duration of the COVID-19 declaration under Section 564(b)(1) of the Act, 21 U.S.C.section 360bbb-3(b)(1), unless the authorization is terminated  or revoked sooner.       Influenza A by PCR NEGATIVE NEGATIVE Final   Influenza B by PCR NEGATIVE NEGATIVE Final    Comment: (NOTE) The Xpert Xpress SARS-CoV-2/FLU/RSV plus assay is intended as an aid in the diagnosis of influenza from Nasopharyngeal swab specimens and should not be used as a sole basis for treatment. Nasal washings and aspirates are unacceptable for Xpert Xpress SARS-CoV-2/FLU/RSV testing.  Fact Sheet for Patients: EntrepreneurPulse.com.au  Fact Sheet for Healthcare Providers: IncredibleEmployment.be  This test is not yet approved or cleared by the [place] FDA and has been authorized for detection and/or diagnosis of SARS-CoV-2 by FDA under an Emergency Use Authorization (EUA). This EUA will remain in effect (meaning this test can be used) for the duration of the COVID-19 declaration under Section 564(b)(1) of the Act, 21 U.S.C. section 360bbb-3(b)(1), unless the authorization is terminated or revoked.  Performed at [facility], [address] [place], [place] [address]   CSF culture w Gram Stain     Status: None (Preliminary result)   Collection Time: 04/09/22 11:55 AM   Specimen: CSF; Cerebrospinal Fluid  Result Value Ref Range Status   Specimen Description CSF  Final   Special Requests Normal  Final   Gram Stain   Final    CYTOSPIN SLIDE WBC SEEN NO RBC SEEN NO ORGANISMS SEEN Performed at [facility], [address] [place], [place] [address]    Culture PENDING  Incomplete   Report Status PENDING  Incomplete    IMAGING  RESULTS:  CXR- no infiltrate I have personally reviewed the films ?CT head no acute findings  Impression/Recommendation ?pt presenting with nausea, headache, tremors , spasms she says Has schizophrenia and on multiple medications Also has migraine No fever LP done - csf cell count N Minimally elevated protein at 59 Clinically and by CSF no  evidence of meningitis both viral or bacterial She has leucocytosis but that has been present since 2015 Unclear reason- recommend heme consult  Could she have an acute attack of  migraine? Could she have medications side effects from multiple schizophrenia drugs Pt has already received a dose of vanco and ceftriaxone and good for 24 hrs ME panel has been sent I will send blood culture Do bladder scan May need CT abdomen Will wait to see whether she will need antibiotics tomorrow Will wait to see whether    DM- on insulin , metformin, Jardiance ? ___________________________________________________ Discussed with patient, mother and requesting provider Note:  This document was prepared using Dragon voice recognition software and may include unintentional dictation errors.

## 2022-04-09 NOTE — ED Provider Notes (Signed)
[facility] Provider Note    Event Date/Time   First MD Initiated Contact with Patient 04/09/22 [phone]     (approximate)   History   Migraine   HPI Level 5 caveat:  history/ROS limited by acute/critical illness Olivia Olson is a 36 y.o. female whose medical history most notably includes insulin-dependent diabetes and a history of schizophrenia as per the patient's mother who is at bedside.  She presents for evaluation of acute onset nausea, vomiting, headache, and generalized abdominal pain.  She has no history of trauma.  The patient is vomiting actively and will only answer simple and basic questions.  She reports that she started feeling sick to her stomach and having a lot of vomiting and that she has developed a severe headache.  The specific onset of the symptoms is somewhat unclear but it seems that the vomiting and generalized ill appearance seems to have started before the headache.  There was a mention of a migraine, and I asked the patient and her mother if she has a history of migraines.  Her mother said that she usually only has headaches when " her voices are worse".  I asked what that meant, and her mother clarified that the patient has schizophrenia and hears auditory hallucinations, and when her hallucinations are worse the headaches seem to get worse.  The patient's mother stated that her voices have been much worse recently, and the patient nodded to confirm this.  They report that she is compliant with her psychiatric medications as well as with her insulin, but they are not sure what the psychiatric medications are.  The patient arrived looking ill, pale, diaphoretic, and actively vomiting.     Physical Exam   Triage Vital Signs: ED Triage Vitals  Enc Vitals Group     BP 04/09/22 0620 132/87     Pulse Rate 04/09/22 0620 (!) 106     Resp 04/09/22 0620 18     Temp 04/09/22 0620 97.8 F (36.6 C)     Temp Source 04/09/22 0620 Oral      SpO2 04/09/22 0620 98 %     Weight --      Height --      Head Circumference --      Peak Flow --      Pain Score 04/09/22 0613 10     Pain Loc --      Pain Edu? --      Excl. in GC? --     Most recent vital signs: Vitals:   04/09/22 1530 04/09/22 1600  BP: 103/66 110/79  Pulse: 91 86  Resp:    Temp:    SpO2: 97% 97%     General: Awake, ill-appearing.  CV:  Good peripheral perfusion.  Mild tachycardia, regular rhythm. Resp:  Normal effort.  Lungs are clear to auscultation. Abd:  No distention.  Mild generalized tenderness to palpation with no localized peritonitis. Other:  No focal neurological deficits.  Moving all 4 extremities.  Actively vomiting and holding an emesis bag to her mouth.  Communicating mostly in 1 or 2 word phrases.  Pale, diaphoretic.   ED Results / Procedures / Treatments   Labs (all labs ordered are listed, but only abnormal results are displayed) Labs Reviewed  COMPREHENSIVE METABOLIC PANEL - Abnormal; Notable for the following components:      Result Value   CO2 20 (*)    Glucose, Bld 224 (*)    Total Protein 9.0 (*)  All other components within normal limits  CBC WITH DIFFERENTIAL/PLATELET - Abnormal; Notable for the following components:   WBC 21.6 (*)    RBC 5.82 (*)    Hemoglobin 16.2 (*)    HCT 49.1 (*)    Platelets 528 (*)    Neutro Abs 15.5 (*)    Lymphs Abs 4.1 (*)    Monocytes Absolute 1.2 (*)    Abs Immature Granulocytes 0.24 (*)    All other components within normal limits  URINALYSIS, ROUTINE W REFLEX MICROSCOPIC - Abnormal; Notable for the following components:   Color, Urine YELLOW (*)    APPearance HAZY (*)    Glucose, UA >=500 (*)    Ketones, ur 20 (*)    Protein, ur 100 (*)    All other components within normal limits  BLOOD GAS, VENOUS - Abnormal; Notable for the following components:   pCO2, Ven 42 (*)    pO2, Ven 46 (*)    All other components within normal limits  BETA-HYDROXYBUTYRIC ACID - Abnormal; Notable  for the following components:   Beta-Hydroxybutyric Acid 0.75 (*)    All other components within normal limits  URINE DRUG SCREEN, QUALITATIVE ([facility] ONLY) - Abnormal; Notable for the following components:   Cannabinoid 50 Ng, Ur [place] POSITIVE (*)    All other components within normal limits  CSF CELL COUNT WITH DIFFERENTIAL - Abnormal; Notable for the following components:   Appearance, CSF CLEAR (*)    RBC Count, CSF 9 (*)    All other components within normal limits  PROTEIN AND GLUCOSE, CSF - Abnormal; Notable for the following components:   Glucose, CSF 119 (*)    Total  Protein, CSF 59 (*)    All other components within normal limits  BASIC METABOLIC PANEL - Abnormal; Notable for the following components:   CO2 21 (*)    Glucose, Bld 175 (*)    All other components within normal limits  CBG MONITORING, ED - Abnormal; Notable for the following components:   Glucose-Capillary 236 (*)    All other components within normal limits  CBG MONITORING, ED - Abnormal; Notable for the following components:   Glucose-Capillary 214 (*)    All other components within normal limits  RESP PANEL BY RT-PCR (FLU A&B, COVID) ARPGX2  CSF CULTURE W GRAM STAIN  CULTURE, BLOOD (ROUTINE X 2)  CULTURE, BLOOD (ROUTINE X 2)  LACTIC ACID, PLASMA  LIPASE, BLOOD  LACTIC ACID, PLASMA  PROCALCITONIN  HIV ANTIBODY (ROUTINE TESTING W REFLEX)  HEMOGLOBIN A1C  MENINGITIS/ENCEPHALITIS PANEL (CSF)  CSF CELL COUNT WITH DIFFERENTIAL  CSF CELL COUNT WITH DIFFERENTIAL  POC URINE PREG, ED  CBG MONITORING, ED     EKG  I reviewed the patient's old EKG to verify that she has no evidence of QTc prolongation.  The last QTc interval was approximately 440 ms     PROCEDURES:  Critical Care performed: No  .1-3 Lead EKG Interpretation  Performed by: Loleta Rose, MD Authorized by: Loleta Rose, MD     Interpretation: abnormal     ECG rate:  105   ECG rate assessment: tachycardic     Rhythm: sinus  tachycardia     Ectopy: none     Conduction: normal      MEDICATIONS ORDERED IN ED:    IMPRESSION / MDM / ASSESSMENT AND PLAN / ED COURSE  I reviewed the triage vital signs and the nursing notes.  Differential diagnosis includes, but is not limited to, DKA, medication or drug side effect, acute intra-abdominal infection, nonspecific gastritis, exacerbation of chronic schizophrenia, acute intracranial bleed.  Less likely meningitis or encephalitis.  Patient's presentation is most consistent with acute presentation with potential threat to life or bodily function.  Patient arrives ill-appearing with extensive and refractory vomiting, generalized abdominal pain, pale, diaphoretic, with a headache.  Slightly tachycardic but afebrile.  No meningismus; the patient is moving her head and neck all around, side to side, and flexing and extending, over the course of trying to find a position of comfort to allow her to vomit.  Based on her current presentation I am most concerned about DKA.  There is also concerned about her worsening auditory hallucinations recently.  I have verified that she has no evidence of QTc prolongation on an old EKG.  I do not feel that I can get an accurate new EKG given her discomfort and her current vomiting.  I am beginning treatment with droperidol 2.5 mg IV and LR 1 L IV bolus.  Labs/studies ordered: Beta hydroxybutyric acid, VBG, urine pregnancy test, urinalysis, CBC with differential, lipase, lactic acid, comprehensive metabolic panel.  The patient is on the cardiac monitor to evaluate for evidence of arrhythmia and/or significant heart rate changes.  The initial step will need to be to control her symptoms with the droperidol.  Once we have an improvement and she is able to lie still and not pose an aspiration risk where she to lie flat, she may benefit from a head CT, though this will be somewhat dependent upon her clinical  presentation and her evaluation.  She came into the emergency department just prior to change of shift.  I discussed the case in person with Dr. Ellender Hose who is assuming ED care and will follow up on her lab work and reassess to determine the next steps.  Based on her initial presentation, I anticipate she will need hospital admission, but again this will depend on her clinical presentation after evaluation and treatment.      FINAL CLINICAL IMPRESSION(S) / ED DIAGNOSES   Final diagnoses:  Bad headache  Nausea and vomiting, unspecified vomiting type  Visual hallucinations  History of schizophrenia     Rx / DC Orders   ED Discharge Orders     None        Note:  This document was prepared using Dragon voice recognition software and may include unintentional dictation errors.   Hinda Kehr, MD 04/09/22 [phone]

## 2022-04-09 NOTE — Assessment & Plan Note (Addendum)
Continue Zoloft, Seroquel, and Wellbutrin.  Psychiatry increase Geodon dose to 80 mg twice a day.

## 2022-04-09 NOTE — Assessment & Plan Note (Deleted)
Patient presents for evaluation of sudden onset headache associated with nausea, dry heaves, vomiting and chills. Noted to have a white count of 21,000 but chart review shows chronically elevated white cell count Had a spinal tap done in the ER and CSF had elevated glucose and protein Patient received empiric antibiotic therapy with Rocephin and vancomycin as well as a dose of dexamethasone. Discussed with infectious disease who recommends to obtain a meningitis and encephalitis panel for now. Further treatment depends on results of further testing. ID has been consulted

## 2022-04-09 NOTE — Assessment & Plan Note (Deleted)
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 Monitor closely during this hospitalization

## 2022-04-09 NOTE — ED Notes (Signed)
Pt ambulatory to and from restroom without difficulty and back to bed. Pt denies further needs at this time. Call light is in reach, family at bedside. VSS, NAD. WCTM.

## 2022-04-09 NOTE — ED Provider Notes (Signed)
Assumed care from Dr. Karma Greaser at 7 AM. Briefly, the patient is a 36 y.o. female with PMHx of  has a past medical history of Diabetes mellitus without complication ([place]), Migraines, and Schizophrenia ([place]). here with headache, chills, fever, neck stiffness.   Labs Reviewed  COMPREHENSIVE METABOLIC PANEL - Abnormal; Notable for the following components:      Result Value   CO2 20 (*)    Glucose, Bld 224 (*)    Total Protein 9.0 (*)    All other components within normal limits  CBC WITH DIFFERENTIAL/PLATELET - Abnormal; Notable for the following components:   WBC 21.6 (*)    RBC 5.82 (*)    Hemoglobin 16.2 (*)    HCT 49.1 (*)    Platelets 528 (*)    Neutro Abs 15.5 (*)    Lymphs Abs 4.1 (*)    Monocytes Absolute 1.2 (*)    Abs Immature Granulocytes 0.24 (*)    All other components within normal limits  URINALYSIS, ROUTINE W REFLEX MICROSCOPIC - Abnormal; Notable for the following components:   Color, Urine YELLOW (*)    APPearance HAZY (*)    Glucose, UA >=500 (*)    Ketones, ur 20 (*)    Protein, ur 100 (*)    All other components within normal limits  BLOOD GAS, VENOUS - Abnormal; Notable for the following components:   pCO2, Ven 42 (*)    pO2, Ven 46 (*)    All other components within normal limits  BETA-HYDROXYBUTYRIC ACID - Abnormal; Notable for the following components:   Beta-Hydroxybutyric Acid 0.75 (*)    All other components within normal limits  URINE DRUG SCREEN, QUALITATIVE ([facility] ONLY) - Abnormal; Notable for the following components:   Cannabinoid 50 Ng, Ur [place] POSITIVE (*)    All other components within normal limits  CSF CELL COUNT WITH DIFFERENTIAL - Abnormal; Notable for the following components:   Appearance, CSF CLEAR (*)    RBC Count, CSF 9 (*)    All other components within normal limits  PROTEIN AND GLUCOSE, CSF - Abnormal; Notable for the following components:   Glucose, CSF 119 (*)    Total  Protein, CSF 59 (*)    All other components within normal  limits  BASIC METABOLIC PANEL - Abnormal; Notable for the following components:   CO2 21 (*)    Glucose, Bld 175 (*)    All other components within normal limits  CSF CELL COUNT WITH DIFFERENTIAL - Abnormal; Notable for the following components:   Color, CSF CLEAR (*)    Appearance, CSF COLORLESS (*)    RBC Count, CSF 7 (*)    All other components within normal limits  CBG MONITORING, ED - Abnormal; Notable for the following components:   Glucose-Capillary 236 (*)    All other components within normal limits  CBG MONITORING, ED - Abnormal; Notable for the following components:   Glucose-Capillary 214 (*)    All other components within normal limits  CBG MONITORING, ED - Abnormal; Notable for the following components:   Glucose-Capillary 265 (*)    All other components within normal limits  RESP PANEL BY RT-PCR (FLU A&B, COVID) ARPGX2  CSF CULTURE W GRAM STAIN  CULTURE, BLOOD (ROUTINE X 2)  CULTURE, BLOOD (ROUTINE X 2)  LACTIC ACID, PLASMA  LIPASE, BLOOD  LACTIC ACID, PLASMA  PROCALCITONIN  HIV ANTIBODY (ROUTINE TESTING W REFLEX)  HEMOGLOBIN A1C  MENINGITIS/ENCEPHALITIS PANEL (CSF)  CSF CELL COUNT WITH DIFFERENTIAL  BASIC  METABOLIC PANEL  CBC  POC URINE PREG, ED    Course of Care: -Pt complaining of persistent, severe headache despite haldol x 2, fluids, symptomatic control. On my assessment, pt states she does not usually have headaches and mother confirms this. Labs show hyperglycemia but no DKA, but pt does have significant leukocytosis. No focal neuro deficits. CT head is negative. -Given +HA, neck stiffness, leukocytosis with subjective fevers, chills and tachycardia, concern for possible meningitis. After informed consent, LP performed - tolerated well. Pt does have high protein, WBCs on CSF c/f possible viral meningitis though right at cut off for WBCs. Will admit for fluids, monitoring. Pt was given iv abx for meningitis pending results of LP.   .Lumbar  Puncture  Date/Time: 04/09/2022 7:44 PM  Performed by: Duffy Bruce, MD Authorized by: Duffy Bruce, MD   Consent:    Consent obtained:  Verbal and written   Consent given by:  Patient and parent   Risks, benefits, and alternatives were discussed: yes     Risks discussed:  Bleeding, nerve damage, repeat procedure, pain, infection and headache   Alternatives discussed:  Alternative treatment Universal protocol:    Procedure explained and questions answered to patient or proxy's satisfaction: yes     Relevant documents present and verified: yes     Test results available: yes     Imaging studies available: yes     Required blood products, implants, devices, and special equipment available: yes     Immediately prior to procedure a time out was called: yes     Site/side marked: yes     Patient identity confirmed:  Verbally with patient Pre-procedure details:    Procedure purpose:  Diagnostic   Preparation: Patient was prepped and draped in usual sterile fashion   Anesthesia:    Anesthesia method:  Local infiltration   Local anesthetic:  Lidocaine 1% w/o epi Procedure details:    Lumbar space:  L4-L5 interspace   Patient position:  R lateral decubitus   Needle gauge:  20   Needle type:  Spinal needle - Quincke tip   Needle length (in):  3.5   Number of attempts:  1   Fluid appearance:  Clear   Tubes of fluid:  4   Total volume (ml):  9        Duffy Bruce, MD 04/09/22 1946

## 2022-04-10 DIAGNOSIS — E1169 Type 2 diabetes mellitus with other specified complication: Secondary | ICD-10-CM | POA: Diagnosis not present

## 2022-04-10 DIAGNOSIS — F203 Undifferentiated schizophrenia: Secondary | ICD-10-CM

## 2022-04-10 DIAGNOSIS — D72829 Elevated white blood cell count, unspecified: Secondary | ICD-10-CM | POA: Diagnosis not present

## 2022-04-10 DIAGNOSIS — R519 Headache, unspecified: Secondary | ICD-10-CM | POA: Diagnosis not present

## 2022-04-10 DIAGNOSIS — Z8659 Personal history of other mental and behavioral disorders: Secondary | ICD-10-CM | POA: Diagnosis not present

## 2022-04-10 DIAGNOSIS — E119 Type 2 diabetes mellitus without complications: Secondary | ICD-10-CM | POA: Diagnosis not present

## 2022-04-10 DIAGNOSIS — G039 Meningitis, unspecified: Secondary | ICD-10-CM | POA: Diagnosis not present

## 2022-04-10 DIAGNOSIS — F209 Schizophrenia, unspecified: Secondary | ICD-10-CM | POA: Diagnosis not present

## 2022-04-10 DIAGNOSIS — E781 Pure hyperglyceridemia: Secondary | ICD-10-CM

## 2022-04-10 DIAGNOSIS — I959 Hypotension, unspecified: Secondary | ICD-10-CM

## 2022-04-10 DIAGNOSIS — G43809 Other migraine, not intractable, without status migrainosus: Secondary | ICD-10-CM | POA: Diagnosis not present

## 2022-04-10 DIAGNOSIS — E669 Obesity, unspecified: Secondary | ICD-10-CM

## 2022-04-10 LAB — CBC
HCT: 40.9 % (ref 36.0–46.0)
Hemoglobin: 13.4 g/dL (ref 12.0–15.0)
MCH: 28 pg (ref 26.0–34.0)
MCHC: 32.8 g/dL (ref 30.0–36.0)
MCV: 85.6 fL (ref 80.0–100.0)
Platelets: 415 10*3/uL — ABNORMAL HIGH (ref 150–400)
RBC: 4.78 MIL/uL (ref 3.87–5.11)
RDW: 13.9 % (ref 11.5–15.5)
WBC: 20.8 10*3/uL — ABNORMAL HIGH (ref 4.0–10.5)
nRBC: 0 % (ref 0.0–0.2)

## 2022-04-10 LAB — RESPIRATORY PANEL BY PCR

## 2022-04-10 LAB — BASIC METABOLIC PANEL
Anion gap: 13 (ref 5–15)
BUN: 7 mg/dL (ref 6–20)
CO2: 23 mmol/L (ref 22–32)
Calcium: 9.4 mg/dL (ref 8.9–10.3)
Chloride: 104 mmol/L (ref 98–111)
Creatinine, Ser: 0.58 mg/dL (ref 0.44–1.00)
GFR, Estimated: >60 mL/min (ref >60)
Glucose, Bld: 187 mg/dL — ABNORMAL HIGH (ref 70–99)
Potassium: 3.9 mmol/L (ref 3.5–5.1)
Sodium: 140 mmol/L (ref 135–145)

## 2022-04-10 LAB — GLUCOSE, CAPILLARY
Glucose-Capillary: 166 mg/dL — ABNORMAL HIGH (ref 70–99)
Glucose-Capillary: 120 mg/dL — ABNORMAL HIGH (ref 70–99)

## 2022-04-10 LAB — HEMOGLOBIN A1C
Hgb A1c MFr Bld: 7.1 % — ABNORMAL HIGH (ref 4.8–5.6)
Mean Plasma Glucose: 157.07 mg/dL

## 2022-04-10 MED ORDER — ZIPRASIDONE HCL 80 MG PO CAPS: 80.0000 mg | ORAL_CAPSULE | Freq: Two times a day (BID) | ORAL | 0 refills | Status: AC

## 2022-04-10 MED ORDER — ZIPRASIDONE HCL 80 MG PO CAPS
80.0000 mg | ORAL_CAPSULE | Freq: Two times a day (BID) | ORAL | Status: DC
  Filled 2022-04-10: qty 1

## 2022-04-10 MED ORDER — INSULIN DETEMIR 100 UNIT/ML FLEXPEN: 30.0000 [IU] | PEN_INJECTOR | Freq: Every day | SUBCUTANEOUS | 11 refills | Status: AC

## 2022-04-10 NOTE — Assessment & Plan Note (Signed)
This has resolved.  Meningitis ruled out.

## 2022-04-10 NOTE — Assessment & Plan Note (Signed)
BMI 32.18 with height and weight in computer.

## 2022-04-10 NOTE — Assessment & Plan Note (Signed)
Hold lisinopril

## 2022-04-10 NOTE — Discharge Summary (Signed)
Physician Discharge Summary   Patient: Olivia Olson MRN: [id number] DOB: February 25, 1986  Admit date:     04/09/2022  Discharge date: 04/10/22  Discharge Physician: Alford Highlandichard Haydyn Girvan   PCP: System, Provider Not In   Recommendations at discharge:   Follow-up with your medical doctor 5 days Follow-up your psychiatrist 2 weeks Referral to hematology for persistently elevated white blood cell count over the years.  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  The patient presented with headache, tremors, spasms and nausea.  CT scan of the head was negative.  The patient was given steroids and antibiotics.  Lumbar puncture was performed.  Meningitis profile was negative.  Respiratory panel was negative.  COVID test and influenza was negative.  No organisms seen on Gram stain of the CSF and cultures so far negative.  Blood cultures negative to date.  The patient was feeling much better at the time of discharge and ready to go home.  Of note lumbar puncture procedure note not in computer at this time.  Would have been nice to see the opening pressure.  Assessment and Plan: * Headache This has resolved.  Meningitis ruled out.  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  Can go back on Levemir insulin 30 units in the evening.  If sugars remain elevated can go back up to 35 units in the evening.  Recommend checking sugars 4 times a day.  Schizophrenia (HCC) Continue Zoloft, Seroquel, and Wellbutrin.  Psychiatry increase Geodon dose to 80 mg twice a day.  Elevated white blood cell count Looking back at prior labs this has been elevated for few years now.  Recommend following up  with hematology as outpatient  Hypotension Hold lisinopril         Consultants: Infectious disease, psychiatry Procedures performed: Lumbar puncture Disposition: Home Diet recommendation:  Carb modified diet DISCHARGE MEDICATION: Allergies as of 04/10/2022       Reactions   Codeine Hives, Rash   Abilify [aripiprazole] Other (See Comments)   Agitation   Morphine And Related Rash   Risperdal [risperidone] Hives, Rash   Vicodin [hydrocodone-acetaminophen] Rash        Medication List     STOP taking these medications    fluticasone 50 MCG/ACT nasal spray Commonly known as: FLONASE   glipiZIDE 10 MG 24 hr tablet Commonly known as: GLUCOTROL XL   lisinopril 2.5 MG tablet Commonly known as: ZESTRIL       TAKE these medications    buPROPion ER 100 MG 12 hr tablet Commonly known as: WELLBUTRIN SR Take 100 mg by mouth 2 (two) times daily.   ezetimibe 10 MG tablet Commonly known as: ZETIA Take 10 mg by mouth daily.   gabapentin 100 MG capsule Commonly known as: NEURONTIN Take 100 mg by mouth 3 (three) times daily.   gemfibrozil 600 MG tablet Commonly known as: LOPID Take 600 mg by mouth 2 (two) times daily.   insulin detemir 100 UNIT/ML FlexPen Commonly known as: LEVEMIR Inject 30 Units into the skin at bedtime. What changed: how much to take   Jardiance 10 MG Tabs tablet Generic drug: empagliflozin Take 10 mg by mouth daily.   metFORMIN 1000 MG tablet Commonly known as:  GLUCOPHAGE Take 1,000 mg by mouth 2 (two) times daily.   ondansetron 4 MG disintegrating tablet Commonly known as: Zofran ODT Take 1 tablet (4 mg total) by mouth every 8 (eight) hours as needed for nausea or vomiting. What changed: Another medication with the same name was removed. Continue taking this medication, and follow the directions you see here.   QUEtiapine 50 MG tablet Commonly known as: SEROQUEL Take 50 mg by mouth daily.   sertraline 100 MG tablet Commonly known  as: ZOLOFT Take 100 mg by mouth 2 (two) times daily.   Ventolin HFA 108 (90 Base) MCG/ACT inhaler Generic drug: albuterol Inhale 2 puffs into the lungs every 4 (four) hours as needed.   ziprasidone 80 MG capsule Commonly known as: GEODON Take 1 capsule (80 mg total) by mouth 2 (two) times daily with a meal. What changed:  medication strength how much to take when to take this        Follow-up Information     Your medical doctor. Schedule an appointment as soon as possible for a visit in 5 day(s).   Why: hospital follow-up patient to make own appointment        Your psychiatrist. Schedule an appointment as soon as possible for a visit in 2 week(s).   Why: Patient to call and schedule appointment for hospital followup        Rickard Patience, MD Follow up in 3 week(s).   Specialty: Oncology Why: persistant elevated WBC Contact information: [address] [place] [address] [phone]                Discharge Exam: Ceasar Mons Weights   04/09/22 1951  Weight: 93.2 kg   Physical Exam HENT:     Head: Normocephalic.     Mouth/Throat:     Pharynx: No oropharyngeal exudate.  Eyes:     General: Lids are normal.     Conjunctiva/sclera: Conjunctivae normal.  Cardiovascular:     Rate and Rhythm: Normal rate and regular rhythm.     Heart sounds: Normal heart sounds, S1 normal and S2 normal.  Pulmonary:     Breath sounds: No decreased breath sounds, wheezing, rhonchi or rales.  Abdominal:     Palpations: Abdomen is soft.     Tenderness: There is no abdominal tenderness.  Musculoskeletal:     Right lower leg: No swelling.     Left lower leg: No swelling.  Skin:    General: Skin is warm.     Findings: No rash.  Neurological:     Mental Status: She is alert.     Comments: Answers questions appropriately      Condition at discharge: stable  The results of significant diagnostics from this hospitalization (including imaging, microbiology, ancillary and  laboratory) are listed below for reference.   Imaging Studies: DG Chest Portable 1 View  Result Date: 04/09/2022 CLINICAL DATA:  Headache, nausea, vomiting EXAM: PORTABLE CHEST 1 VIEW COMPARISON:  Chest radiograph 04/07/2010 FINDINGS: The cardiomediastinal silhouette is normal There is no focal consolidation or pulmonary edema. There is no pleural effusion or pneumothorax. There is no acute osseous abnormality. IMPRESSION: No radiographic evidence of acute cardiopulmonary process. Electronically Signed   By: Lesia Hausen M.D.   On: 04/09/2022 11:05   CT HEAD WO CONTRAST ( )  Result Date: 04/09/2022 CLINICAL DATA:  Headache EXAM: CT HEAD WITHOUT CONTRAST TECHNIQUE: Contiguous axial images were obtained from the base of the skull through the vertex without intravenous contrast. RADIATION DOSE  REDUCTION: This exam was performed according to the departmental dose-optimization program which includes automated exposure control, adjustment of the mA and/or kV according to patient size and/or use of iterative reconstruction technique. COMPARISON:  Head CT 07/07/2011 FINDINGS: Brain: There is no acute intracranial hemorrhage, extra-axial fluid collection, or acute infarct. Parenchymal volume is normal. The ventricles are normal in size. Gray-white differentiation is preserved. There is no mass lesion.  There is no mass effect or midline shift. Vascular: No hyperdense vessel or unexpected calcification. Skull: Normal. Negative for fracture or focal lesion. Sinuses/Orbits: The paranasal sinuses are clear. The globes and orbits are unremarkable. Other: None. IMPRESSION: Normal head CT. Electronically Signed   By: Lesia Hausen M.D.   On: 04/09/2022 10:57    Microbiology: Results for orders placed or performed during the hospital encounter of 04/09/22  Resp Panel by RT-PCR (Flu A&B, Covid) Anterior Nasal Swab     Status: None   Collection Time: 04/09/22 10:39 AM   Specimen: Anterior Nasal Swab  Result Value Ref  Range Status   SARS Coronavirus 2 by RT PCR NEGATIVE NEGATIVE Final    Comment: (NOTE) SARS-CoV-2 target nucleic acids are NOT DETECTED.  The SARS-CoV-2 RNA is generally detectable in upper respiratory specimens during the acute phase of infection. The lowest concentration of SARS-CoV-2 viral copies this assay can detect is 138 copies/mL. A negative result does not preclude SARS-Cov-2 infection and should not be used as the sole basis for treatment or other patient management decisions. A negative result may occur with  improper specimen collection/handling, submission of specimen other than nasopharyngeal swab, presence of viral mutation(s) within the areas targeted by this assay, and inadequate number of viral copies(<138 copies/mL). A negative result must be combined with clinical observations, patient history, and epidemiological information. The expected result is Negative.  Fact Sheet for Patients:  BloggerCourse.com  Fact Sheet for Healthcare Providers:  SeriousBroker.it  This test is no t yet approved or cleared by the [place] FDA and  has been authorized for detection and/or diagnosis of SARS-CoV-2 by FDA under an Emergency Use Authorization (EUA). This EUA will remain  in effect (meaning this test can be used) for the duration of the COVID-19 declaration under Section 564(b)(1) of the Act, 21 U.S.C.section 360bbb-3(b)(1), unless the authorization is terminated  or revoked sooner.       Influenza A by PCR NEGATIVE NEGATIVE Final   Influenza B by PCR NEGATIVE NEGATIVE Final    Comment: (NOTE) The Xpert Xpress SARS-CoV-2/FLU/RSV plus assay is intended as an aid in the diagnosis of influenza from Nasopharyngeal swab specimens and should not be used as a sole basis for treatment. Nasal washings and aspirates are unacceptable for Xpert Xpress SARS-CoV-2/FLU/RSV testing.  Fact Sheet for  Patients: BloggerCourse.com  Fact Sheet for Healthcare Providers: SeriousBroker.it  This test is not yet approved or cleared by the [place] FDA and has been authorized for detection and/or diagnosis of SARS-CoV-2 by FDA under an Emergency Use Authorization (EUA). This EUA will remain in effect (meaning this test can be used) for the duration of the COVID-19 declaration under Section 564(b)(1) of the Act, 21 U.S.C. section 360bbb-3(b)(1), unless the authorization is terminated or revoked.  Performed at [facility], [address] [place], [place] [address]   CSF culture w Gram Stain     Status: None (Preliminary result)   Collection Time: 04/09/22 11:55 AM   Specimen: CSF; Cerebrospinal Fluid  Result Value Ref Range Status   Specimen Description  Final    CSF Performed at [facility], [address], [place], [place] [address]    Special Requests   Final    Normal Performed at [facility], [address] [place], [place] [address]    Gram Stain   Final    CYTOSPIN SLIDE WBC SEEN NO RBC SEEN NO ORGANISMS SEEN Performed at [facility], [address], [place], [place] [address]    Culture   Final    NO GROWTH < 24 HOURS Performed at [facility], [address], [place], [place] [address]    Report Status PENDING  Incomplete  Culture, blood (Routine X 2) w Reflex to ID Panel     Status: None (Preliminary result)   Collection Time: 04/09/22  3:08 PM   Specimen: BLOOD  Result Value Ref Range Status   Specimen Description BLOOD BLOOD LEFT WRIST  Final   Special Requests   Final    BOTTLES DRAWN AEROBIC AND ANAEROBIC Blood Culture results may not be optimal due to an excessive volume of blood received in culture bottles   Culture   Final    NO GROWTH < 24 HOURS Performed at [facility], [address], [place], [place] [address]    Report Status PENDING   Incomplete  Culture, blood (Routine X 2) w Reflex to ID Panel     Status: None (Preliminary result)   Collection Time: 04/09/22  4:09 PM   Specimen: BLOOD  Result Value Ref Range Status   Specimen Description BLOOD BLOOD LEFT FOREARM  Final   Special Requests   Final    BOTTLES DRAWN AEROBIC AND ANAEROBIC Blood Culture results may not be optimal due to an excessive volume of blood received in culture bottles   Culture   Final    NO GROWTH < 24 HOURS Performed at [facility], [address] [place], [place] [address]    Report Status PENDING  Incomplete  Respiratory (~20 pathogens) panel by PCR     Status: None   Collection Time: 04/09/22  9:20 PM   Specimen: Nasopharyngeal Swab; Respiratory  Result Value Ref Range Status   Adenovirus NOT DETECTED NOT DETECTED Final   Coronavirus 229E NOT DETECTED NOT DETECTED Final    Comment: (NOTE) The Coronavirus on the Respiratory Panel, DOES NOT test for the novel  Coronavirus (2019 nCoV)    Coronavirus HKU1 NOT DETECTED NOT DETECTED Final   Coronavirus NL63 NOT DETECTED NOT DETECTED Final   Coronavirus OC43 NOT DETECTED NOT DETECTED Final   Metapneumovirus NOT DETECTED NOT DETECTED Final   Rhinovirus / Enterovirus NOT DETECTED NOT DETECTED Final   Influenza A NOT DETECTED NOT DETECTED Final   Influenza B NOT DETECTED NOT DETECTED Final   Parainfluenza Virus 1 NOT DETECTED NOT DETECTED Final   Parainfluenza Virus 2 NOT DETECTED NOT DETECTED Final   Parainfluenza Virus 3 NOT DETECTED NOT DETECTED Final   Parainfluenza Virus 4 NOT DETECTED NOT DETECTED Final   Respiratory Syncytial Virus NOT DETECTED NOT DETECTED Final   Bordetella pertussis NOT DETECTED NOT DETECTED Final   Bordetella Parapertussis NOT DETECTED NOT DETECTED Final   Chlamydophila pneumoniae NOT DETECTED NOT DETECTED Final   Mycoplasma pneumoniae NOT DETECTED NOT DETECTED Final    Comment: Performed at [facility] [place] [facility], [address] [place], [place] [address]     Labs: CBC: Recent Labs  Lab 04/09/22 0627 04/10/22 0441  WBC 21.6* 20.8*  NEUTROABS 15.5*  --  HGB 16.2* 13.4  HCT 49.1* 40.9  MCV 84.4 85.6  PLT 528* 829*   Basic Metabolic Panel: Recent Labs  Lab 04/09/22 0627 04/09/22 1225 04/10/22 0441  NA 135 139 140  K 3.9 4.4 3.9  CL 103 103 104  CO2 20* 21* 23  GLUCOSE 224* 175* 187*  BUN 11 10 7   CREATININE 0.64 0.50 0.58  CALCIUM 10.0 9.6 9.4   Liver Function Tests: Recent Labs  Lab 04/09/22 0627  AST 23  ALT 24  ALKPHOS 108  BILITOT 0.7  PROT 9.0*  ALBUMIN 4.9   CBG: Recent Labs  Lab 04/09/22 0848 04/09/22 1722 04/09/22 2010 04/10/22 0830 04/10/22 1218  GLUCAP 214* 265* 183* 166* 120*    Discharge time spent: greater than 30 minutes.  Signed: Loletha Grayer, MD [facility] 04/10/2022

## 2022-04-10 NOTE — TOC Transition Note (Signed)
Transition of Care [facility]) - CM/SW Discharge Note   Patient Details  Name: Olivia Olson MRN: [id number] Date of Birth: 17-May-1986  [facility] CM/SW Contact:  Laurena Slimmer, RN Phone Number: 04/10/2022, 3:08 PM   Clinical Narrative:    Patient discharging home. Her mother is in the rooom and will transport her home.    Final next level of care: Home/Self Care Barriers to Discharge: Barriers Resolved   Patient Goals and CMS Choice Patient states their goals for this hospitalization and ongoing recovery are:: To return      Discharge Placement                       Discharge Plan and Services                                     Social Determinants of Health (SDOH) Interventions     Readmission Risk Interventions     No data to display

## 2022-04-10 NOTE — Plan of Care (Signed)

## 2022-04-10 NOTE — Consult Note (Signed)
[facility] Face-to-Face Psychiatry Consult   Reason for Consult: Consult for 36 year old woman with a past history of mental health problems who came to the hospital with headache but with history of possibly having worsening psychotic symptoms Referring Physician:  Earleen Newport Patient Identification: Olivia Olson MRN:  [id number] Principal Diagnosis: Schizophrenia ([place]) Diagnosis:  Principal Problem:   Schizophrenia ([place]) Active Problems:   Meningitis   Diabetes mellitus without complication ([place])   Nicotine dependence   Leukemoid reaction   Headache   Total Time spent with patient: 45 minutes  Subjective:   Olivia Olson is a 36 y.o. female patient admitted with "my head was hurting".  HPI: Patient seen and chart reviewed.  36 year old woman presented to the emergency room in the company of her mother with complaints of having a headache.  She was admitted to the hospital for persistent and worsening headache evaluation accompanied by some GI complaints.  In the emergency room her mother also reported concern that hallucinations have been worse and that in the past patient's headaches had often accompanied a worsening of her psychiatric illness.  Found patient in her room sitting up eating lunch.  She was cooperative but passive and answered questions fairly minimally.  She acknowledged coming into the hospital with a headache and also that her hallucinations had been worse recently.  She said that she always hears voices but that the problem starts when she pays more attention to them which is what had been happening recently.  She denied that they were giving her commands and she denied any suicidal or homicidal thoughts along with them.  Patient claims that she has an outpatient psychiatrist and is currently taking medication.  She is able to name Zoloft, Geodon, gabapentin and prazosin as medicines.  States that the doses in the intake were correct.  Patient says she uses cannabis rarely  does not drink and does not use any other drugs.  Lives with her mother.  Mostly stays home with a fairly quiet life.  Does not mention any particular worsening social stress.  Past Psychiatric History: Patient has a history of schizophrenia and has had hospitalizations in the past.  Documentation in the chart suggest that some years ago she was on clozapine which suggests that she had been nonresponsive to some other medicines and that treatment had been escalated up to a stronger medicine.  Patient recalls the Clozapine and also recalls having been on Risperdal and Abilify although she says that she was "allergic" to those medicines.  She says she has been on her current medicines for "a couple years".  Denies any history of suicide attempts.  Risk to Self:   Risk to Others:   Prior Inpatient Therapy:   Prior Outpatient Therapy:    Past Medical History:  Past Medical History:  Diagnosis Date   Diabetes mellitus without complication ([place])    Migraines    Schizophrenia ([place])     Past Surgical History:  Procedure Laterality Date   CESAREAN SECTION     GANGLION CYST EXCISION Left    LAPAROSCOPIC OOPHERECTOMY     Family History: No family history on file. Family Psychiatric  History: Not aware of any Social History:  Social History   Substance and Sexual Activity  Alcohol Use Never     Social History   Substance and Sexual Activity  Drug Use Yes   Types: Marijuana    Social History   Socioeconomic History   Marital status: Single    Spouse name:  Not on file   Number of children: Not on file   Years of education: Not on file   Highest education level: Not on file  Occupational History   Not on file  Tobacco Use   Smoking status: Every Day    Packs/day: 0.50    Types: Cigarettes   Smokeless tobacco: Never  Vaping Use   Vaping Use: Never used  Substance and Sexual Activity   Alcohol use: Never   Drug use: Yes    Types: Marijuana   Sexual activity: Not on file   Other Topics Concern   Not on file  Social History Narrative   Not on file   Social Determinants of Health   Financial Resource Strain: Not on file  Food Insecurity: No Food Insecurity (04/09/2022)   Hunger Vital Sign    Worried About Running Out of Food in the Last Year: Never true    Ran Out of Food in the Last Year: Never true  Transportation Needs: No Transportation Needs (04/09/2022)   PRAPARE - Administrator, Civil Service (Medical): No    Lack of Transportation (Non-Medical): No  Physical Activity: Not on file  Stress: Not on file  Social Connections: Not on file   Additional Social History:    Allergies:   Allergies  Allergen Reactions   Codeine Hives and Rash   Abilify [Aripiprazole] Other (See Comments)    Agitation   Morphine And Related Rash   Risperdal [Risperidone] Hives and Rash   Vicodin [Hydrocodone-Acetaminophen] Rash    Labs:  Results for orders placed or performed during the hospital encounter of 04/09/22 (from the past 48 hour(s))  CBG monitoring, ED     Status: Abnormal   Collection Time: 04/09/22  6:25 AM  Result Value Ref Range   Glucose-Capillary 236 (H) 70 - 99 mg/dL    Comment: Glucose reference range applies only to samples taken after fasting for at least 8 hours.  Comprehensive metabolic panel     Status: Abnormal   Collection Time: 04/09/22  6:27 AM  Result Value Ref Range   Sodium 135 135 - 145 mmol/L   Potassium 3.9 3.5 - 5.1 mmol/L   Chloride 103 98 - 111 mmol/L   CO2 20 (L) 22 - 32 mmol/L   Glucose, Bld 224 (H) 70 - 99 mg/dL    Comment: Glucose reference range applies only to samples taken after fasting for at least 8 hours.   BUN 11 6 - 20 mg/dL   Creatinine, Ser 3.32 0.44 - 1.00 mg/dL   Calcium 95.1 8.9 - 88.4 mg/dL   Total Protein 9.0 (H) 6.5 - 8.1 g/dL   Albumin 4.9 3.5 - 5.0 g/dL   AST 23 15 - 41 U/L   ALT 24 0 - 44 U/L   Alkaline Phosphatase 108 38 - 126 U/L   Total Bilirubin 0.7 0.3 - 1.2 mg/dL   GFR,  Estimated >16 >60 mL/min    Comment: (NOTE) Calculated using the CKD-EPI Creatinine Equation (2021)    Anion gap 12 5 - 15    Comment: Performed at [facility], [address] [place], [place] [address]  Lipase, blood     Status: None   Collection Time: 04/09/22  6:27 AM  Result Value Ref Range   Lipase 27 11 - 51 U/L    Comment: Performed at [facility], [address] [place], [place] [address]  CBC with Differential     Status: Abnormal  Collection Time: 04/09/22  6:27 AM  Result Value Ref Range   WBC 21.6 (H) 4.0 - 10.5 K/uL   RBC 5.82 (H) 3.87 - 5.11 MIL/uL   Hemoglobin 16.2 (H) 12.0 - 15.0 g/dL   HCT 91.4 (H) 78.2 - 95.6 %   MCV 84.4 80.0 - 100.0 fL   MCH 27.8 26.0 - 34.0 pg   MCHC 33.0 30.0 - 36.0 g/dL   RDW 21.3 08.6 - 57.8 %   Platelets 528 (H) 150 - 400 K/uL   nRBC 0.0 0.0 - 0.2 %   Neutrophils Relative % 71 %   Neutro Abs 15.5 (H) 1.7 - 7.7 K/uL   Lymphocytes Relative 19 %   Lymphs Abs 4.1 (H) 0.7 - 4.0 K/uL   Monocytes Relative 6 %   Monocytes Absolute 1.2 (H) 0.1 - 1.0 K/uL   Eosinophils Relative 2 %   Eosinophils Absolute 0.4 0.0 - 0.5 K/uL   Basophils Relative 1 %   Basophils Absolute 0.1 0.0 - 0.1 K/uL   Immature Granulocytes 1 %   Abs Immature Granulocytes 0.24 (H) 0.00 - 0.07 K/uL    Comment: Performed at [facility], [address] [place], [place] [address]  Urinalysis, Routine w reflex microscopic Urine, Clean Catch     Status: Abnormal   Collection Time: 04/09/22  6:27 AM  Result Value Ref Range   Color, Urine YELLOW (A) YELLOW   APPearance HAZY (A) CLEAR   Specific Gravity, Urine 1.021 1.005 - 1.030   pH 6.0 5.0 - 8.0   Glucose, UA >=500 (A) NEGATIVE mg/dL   Hgb urine dipstick NEGATIVE NEGATIVE   Bilirubin Urine NEGATIVE NEGATIVE   Ketones, ur 20 (A) NEGATIVE mg/dL   Protein, ur 952 (A) NEGATIVE mg/dL   Nitrite NEGATIVE NEGATIVE   Leukocytes,Ua NEGATIVE NEGATIVE   RBC / HPF 0-5 0 - 5 RBC/hpf   WBC, UA 0-5  0 - 5 WBC/hpf   Bacteria, UA NONE SEEN NONE SEEN   Squamous Epithelial / LPF 6-10 0 - 5   Mucus PRESENT    Hyaline Casts, UA PRESENT     Comment: Performed at [facility], [address] [place], [place] [address]  Beta-hydroxybutyric acid     Status: Abnormal   Collection Time: 04/09/22  6:27 AM  Result Value Ref Range   Beta-Hydroxybutyric Acid 0.75 (H) 0.05 - 0.27 mmol/L    Comment: Performed at [facility], [address] [place], [place] [address]  Urine Drug Screen, Qualitative ([facility] only)     Status: Abnormal   Collection Time: 04/09/22  6:27 AM  Result Value Ref Range   Tricyclic, Ur Screen NONE DETECTED NONE DETECTED   Amphetamines, Ur Screen NONE DETECTED NONE DETECTED   MDMA (Ecstasy)Ur Screen NONE DETECTED NONE DETECTED   Cocaine Metabolite,Ur [place] NONE DETECTED NONE DETECTED   Opiate, Ur Screen NONE DETECTED NONE DETECTED   Phencyclidine (PCP) Ur S NONE DETECTED NONE DETECTED   Cannabinoid 50 Ng, Ur  POSITIVE (A) NONE DETECTED   Barbiturates, Ur Screen NONE DETECTED NONE DETECTED   Benzodiazepine, Ur Scrn NONE DETECTED NONE DETECTED   Methadone Scn, Ur NONE DETECTED NONE DETECTED    Comment: (NOTE) Tricyclics + metabolites, urine    Cutoff 1000 ng/mL Amphetamines + metabolites, urine  Cutoff 1000 ng/mL MDMA (Ecstasy), urine              Cutoff 500 ng/mL Cocaine Metabolite, urine          Cutoff  300 ng/mL Opiate + metabolites, urine        Cutoff 300 ng/mL Phencyclidine (PCP), urine         Cutoff 25 ng/mL Cannabinoid, urine                 Cutoff 50 ng/mL Barbiturates + metabolites, urine  Cutoff 200 ng/mL Benzodiazepine, urine              Cutoff 200 ng/mL Methadone, urine                   Cutoff 300 ng/mL  The urine drug screen provides only a preliminary, unconfirmed analytical test result and should not be used for non-medical purposes. Clinical consideration and professional judgment should be applied to any positive drug screen result  due to possible interfering substances. A more specific alternate chemical method must be used in order to obtain a confirmed analytical result. Gas chromatography / mass spectrometry (GC/MS) is the preferred confirm atory method. Performed at [facility], [address] [place], [place] [address]   Procalcitonin - Baseline     Status: None   Collection Time: 04/09/22  6:27 AM  Result Value Ref Range   Procalcitonin <0.10 ng/mL    Comment:        Interpretation: PCT (Procalcitonin) <= 0.5 ng/mL: Systemic infection (sepsis) is not likely. Local bacterial infection is possible. (NOTE)       Sepsis PCT Algorithm           Lower Respiratory Tract                                      Infection PCT Algorithm    ----------------------------     ----------------------------         PCT < 0.25 ng/mL                PCT < 0.10 ng/mL          Strongly encourage             Strongly discourage   discontinuation of antibiotics    initiation of antibiotics    ----------------------------     -----------------------------       PCT 0.25 - 0.50 ng/mL            PCT 0.10 - 0.25 ng/mL               OR       >80% decrease in PCT            Discourage initiation of                                            antibiotics      Encourage discontinuation           of antibiotics    ----------------------------     -----------------------------         PCT >= 0.50 ng/mL              PCT 0.26 - 0.50 ng/mL               AND        <80% decrease in PCT             Encourage initiation of  antibiotics       Encourage continuation           of antibiotics    ----------------------------     -----------------------------        PCT >= 0.50 ng/mL                  PCT > 0.50 ng/mL               AND         increase in PCT                  Strongly encourage                                      initiation of antibiotics    Strongly encourage escalation            of antibiotics                                     -----------------------------                                           PCT <= 0.25 ng/mL                                                 OR                                        > 80% decrease in PCT                                      Discontinue / Do not initiate                                             antibiotics  Performed at [facility], [address] [place], [place] [address]   Lactic acid, plasma     Status: None   Collection Time: 04/09/22  6:48 AM  Result Value Ref Range   Lactic Acid, Venous 1.1 0.5 - 1.9 mmol/L    Comment: Performed at [facility], [address] [place], [place] [address]  Blood gas, venous     Status: Abnormal   Collection Time: 04/09/22  6:48 AM  Result Value Ref Range   pH, Ven 7.41 7.25 - 7.43   pCO2, Ven 42 (L) 44 - 60 mmHg   pO2, Ven 46 (H) 32 - 45 mmHg   Bicarbonate 26.6 20.0 - 28.0 mmol/L   Acid-Base Excess 1.7 0.0 - 2.0 mmol/L   O2 Saturation 77.1 %   Patient temperature 37.0    Collection site VEIN     Comment: Performed at [facility], [address], [place], [place] [address]  POC Urine Pregnancy, ED  Status: None   Collection Time: 04/09/22  7:57 AM  Result Value Ref Range   Preg Test, Ur    POC CBG, ED     Status: Abnormal   Collection Time: 04/09/22  8:48 AM  Result Value Ref Range   Glucose-Capillary 214 (H) 70 - 99 mg/dL    Comment: Glucose reference range applies only to samples taken after fasting for at least 8 hours.  Resp Panel by RT-PCR (Flu A&B, Covid) Anterior Nasal Swab     Status: None   Collection Time: 04/09/22 10:39 AM   Specimen: Anterior Nasal Swab  Result Value Ref Range   SARS Coronavirus 2 by RT PCR NEGATIVE NEGATIVE    Comment: (NOTE) SARS-CoV-2 target nucleic acids are NOT DETECTED.  The SARS-CoV-2 RNA is generally detectable in upper respiratory specimens during the acute phase of infection. The  lowest concentration of SARS-CoV-2 viral copies this assay can detect is 138 copies/mL. A negative result does not preclude SARS-Cov-2 infection and should not be used as the sole basis for treatment or other patient management decisions. A negative result may occur with  improper specimen collection/handling, submission of specimen other than nasopharyngeal swab, presence of viral mutation(s) within the areas targeted by this assay, and inadequate number of viral copies(<138 copies/mL). A negative result must be combined with clinical observations, patient history, and epidemiological information. The expected result is Negative.  Fact Sheet for Patients:  BloggerCourse.comhttps://www.fda.gov/media/152166/download  Fact Sheet for Healthcare Providers:  SeriousBroker.ithttps://www.fda.gov/media/152162/download  This test is no t yet approved or cleared by the [place] FDA and  has been authorized for detection and/or diagnosis of SARS-CoV-2 by FDA under an Emergency Use Authorization (EUA). This EUA will remain  in effect (meaning this test can be used) for the duration of the COVID-19 declaration under Section 564(b)(1) of the Act, 21 U.S.C.section 360bbb-3(b)(1), unless the authorization is terminated  or revoked sooner.       Influenza A by PCR NEGATIVE NEGATIVE   Influenza B by PCR NEGATIVE NEGATIVE    Comment: (NOTE) The Xpert Xpress SARS-CoV-2/FLU/RSV plus assay is intended as an aid in the diagnosis of influenza from Nasopharyngeal swab specimens and should not be used as a sole basis for treatment. Nasal washings and aspirates are unacceptable for Xpert Xpress SARS-CoV-2/FLU/RSV testing.  Fact Sheet for Patients: BloggerCourse.comhttps://www.fda.gov/media/152166/download  Fact Sheet for Healthcare Providers: SeriousBroker.ithttps://www.fda.gov/media/152162/download  This test is not yet approved or cleared by the [place] FDA and has been authorized for detection and/or diagnosis of SARS-CoV-2 by FDA under an Emergency  Use Authorization (EUA). This EUA will remain in effect (meaning this test can be used) for the duration of the COVID-19 declaration under Section 564(b)(1) of the Act, 21 U.S.C. section 360bbb-3(b)(1), unless the authorization is terminated or revoked.  Performed at [facility], [address] [place], [place] [address]   CSF culture w Gram Stain     Status: None (Preliminary result)   Collection Time: 04/09/22 11:55 AM   Specimen: CSF; Cerebrospinal Fluid  Result Value Ref Range   Specimen Description      CSF Performed at [facility], [address] [place], [place] [address]    Special Requests      Normal Performed at [facility], [address] [place], [place] [address]    Gram Stain      CYTOSPIN SLIDE WBC SEEN NO RBC SEEN NO ORGANISMS SEEN Performed at [facility], [address] [place], [place] [address]  Culture      NO GROWTH < 24 HOURS Performed at [facility], [address], [place], [place] [address]    Report Status PENDING   Meningitis/Encephalitis Panel (CSF)     Status: None   Collection Time: 04/09/22 11:55 AM  Result Value Ref Range   Cryptococcus neoformans/gattii (CSF) NOT DETECTED NOT DETECTED    Comment: (NOTE) Patients with a suspicion of cryptococcal meningitis should be tested  for cryptococcal antigen (CrAg).      Cytomegalovirus (CSF) NOT DETECTED NOT DETECTED   Enterovirus (CSF) NOT DETECTED NOT DETECTED   Escherichia coli K1 (CSF) NOT DETECTED NOT DETECTED    Comment: (NOTE) Only E. coli strains possessing the K1 capsular antigen will be detected.      Haemophilus influenzae (CSF) NOT DETECTED NOT DETECTED   Herpes simplex virus 1 (CSF) NOT DETECTED NOT DETECTED   Herpes simplex virus 2 (CSF) NOT DETECTED NOT DETECTED   Human herpesvirus 6 (CSF) NOT DETECTED NOT DETECTED   Human parechovirus (CSF) NOT DETECTED NOT DETECTED   Listeria monocytogenes (CSF) NOT DETECTED NOT  DETECTED   Neisseria meningitis (CSF) NOT DETECTED NOT DETECTED    Comment: (NOTE) Only encapsulated strains of N. meningitidis will be detected.     Streptococcus agalactiae (CSF) NOT DETECTED NOT DETECTED   Streptococcus pneumoniae (CSF) NOT DETECTED NOT DETECTED   Varicella zoster virus (CSF) NOT DETECTED NOT DETECTED    Comment: Performed at [facility], [address], [place], [place] [address]  CSF cell count with differential     Status: Abnormal   Collection Time: 04/09/22 11:55 AM  Result Value Ref Range   Tube # 2    Color, CSF CLEAR (A) COLORLESS   Appearance, CSF COLORLESS (A) CLEAR   Supernatant CLEAR    RBC Count, CSF 7 (H) 0 - 3 /cu mm   WBC, CSF 2 0 - 5 /cu mm   Segmented Neutrophils-CSF 0 %   Lymphs, CSF 100 %   Monocyte-Macrophage-Spinal Fluid 0 %   Eosinophils, CSF 0 %    Comment: Performed at [facility], [address] [place], [place] [address]  CSF cell count with differential     Status: Abnormal   Collection Time: 04/09/22 11:56 AM  Result Value Ref Range   Tube # 3     Comment: CORRECTED ON 09/28 AT 1329: PREVIOUSLY REPORTED AS 2   Color, CSF COLORLESS COLORLESS   Appearance, CSF CLEAR (A) CLEAR   Supernatant CLEAR    RBC Count, CSF 9 (H) 0 - 3 /cu mm   WBC, CSF 5 0 - 5 /cu mm   Segmented Neutrophils-CSF 10 %   Lymphs, CSF 76 %   Monocyte-Macrophage-Spinal Fluid 14 %   Eosinophils, CSF 0 %    Comment: Performed at [facility], [address] [place], [place] [address]  Protein and glucose, CSF     Status: Abnormal   Collection Time: 04/09/22 11:56 AM  Result Value Ref Range   Glucose, CSF 119 (H) 40 - 70 mg/dL   Total  Protein, CSF 59 (H) 15 - 45 mg/dL    Comment: Performed at [facility], [address] [place], [place] [address]  Lactic acid, plasma     Status: None   Collection Time: 04/09/22 11:57 AM  Result Value Ref Range   Lactic Acid, Venous 1.0 0.5 - 1.9 mmol/L    Comment: Performed at  [facility], [address] [place], [place] [address]  Basic metabolic panel     Status: Abnormal   Collection Time: 04/09/22 12:25 PM  Result Value Ref Range   Sodium 139 135 - 145 mmol/L   Potassium 4.4 3.5 - 5.1 mmol/L   Chloride 103 98 - 111 mmol/L   CO2 21 (L) 22 - 32 mmol/L   Glucose, Bld 175 (H) 70 - 99 mg/dL    Comment: Glucose reference range applies only to samples taken after fasting for at least 8 hours.   BUN 10 6 - 20 mg/dL   Creatinine, Ser 1.91 0.44 - 1.00 mg/dL   Calcium 9.6 8.9 - 47.8 mg/dL   GFR, Estimated >29 >56 mL/min    Comment: (NOTE) Calculated using the CKD-EPI Creatinine Equation (2021)    Anion gap 15 5 - 15    Comment: Performed at [facility], [address] [place], [place] [address]  Culture, blood (Routine X 2) w Reflex to ID Panel     Status: None (Preliminary result)   Collection Time: 04/09/22  3:08 PM   Specimen: BLOOD  Result Value Ref Range   Specimen Description BLOOD BLOOD LEFT WRIST    Special Requests      BOTTLES DRAWN AEROBIC AND ANAEROBIC Blood Culture results may not be optimal due to an excessive volume of blood received in culture bottles   Culture      NO GROWTH < 24 HOURS Performed at [facility], [address] [place], [place] [address]    Report Status PENDING   Hemoglobin A1c     Status: Abnormal   Collection Time: 04/09/22  4:08 PM  Result Value Ref Range   Hgb A1c MFr Bld 7.1 (H) 4.8 - 5.6 %    Comment: (NOTE) Pre diabetes:          5.7%-6.4%  Diabetes:              >6.4%  Glycemic control for   <7.0% adults with diabetes    Mean Plasma Glucose 157.07 mg/dL    Comment: Performed at [facility], [address] [place], [place] [address]  Culture, blood (Routine X 2) w Reflex to ID Panel     Status: None (Preliminary result)   Collection Time: 04/09/22  4:09 PM   Specimen: BLOOD  Result Value Ref Range   Specimen Description BLOOD BLOOD LEFT FOREARM    Special Requests       BOTTLES DRAWN AEROBIC AND ANAEROBIC Blood Culture results may not be optimal due to an excessive volume of blood received in culture bottles   Culture      NO GROWTH < 24 HOURS Performed at [facility], [address] [place], [place] [address]    Report Status PENDING   POC CBG, ED     Status: Abnormal   Collection Time: 04/09/22  5:22 PM  Result Value Ref Range   Glucose-Capillary 265 (H) 70 - 99 mg/dL    Comment: Glucose reference range applies only to samples taken after fasting for at least 8 hours.  Glucose, capillary     Status: Abnormal   Collection Time: 04/09/22  8:10 PM  Result Value Ref Range   Glucose-Capillary 183 (H) 70 - 99 mg/dL    Comment: Glucose reference range applies only to samples taken after fasting for at least 8 hours.  Respiratory (~20 pathogens) panel by PCR     Status: None   Collection Time: 04/09/22  9:20  PM   Specimen: Nasopharyngeal Swab; Respiratory  Result Value Ref Range   Adenovirus NOT DETECTED NOT DETECTED   Coronavirus 229E NOT DETECTED NOT DETECTED    Comment: (NOTE) The Coronavirus on the Respiratory Panel, DOES NOT test for the novel  Coronavirus (2019 nCoV)    Coronavirus HKU1 NOT DETECTED NOT DETECTED   Coronavirus NL63 NOT DETECTED NOT DETECTED   Coronavirus OC43 NOT DETECTED NOT DETECTED   Metapneumovirus NOT DETECTED NOT DETECTED   Rhinovirus / Enterovirus NOT DETECTED NOT DETECTED   Influenza A NOT DETECTED NOT DETECTED   Influenza B NOT DETECTED NOT DETECTED   Parainfluenza Virus 1 NOT DETECTED NOT DETECTED   Parainfluenza Virus 2 NOT DETECTED NOT DETECTED   Parainfluenza Virus 3 NOT DETECTED NOT DETECTED   Parainfluenza Virus 4 NOT DETECTED NOT DETECTED   Respiratory Syncytial Virus NOT DETECTED NOT DETECTED   Bordetella pertussis NOT DETECTED NOT DETECTED   Bordetella Parapertussis NOT DETECTED NOT DETECTED   Chlamydophila pneumoniae NOT DETECTED NOT DETECTED   Mycoplasma pneumoniae NOT DETECTED NOT  DETECTED    Comment: Performed at [facility], [address] [place], [place] [address]  Basic metabolic panel     Status: Abnormal   Collection Time: 04/10/22  4:41 AM  Result Value Ref Range   Sodium 140 135 - 145 mmol/L   Potassium 3.9 3.5 - 5.1 mmol/L   Chloride 104 98 - 111 mmol/L   CO2 23 22 - 32 mmol/L   Glucose, Bld 187 (H) 70 - 99 mg/dL    Comment: Glucose reference range applies only to samples taken after fasting for at least 8 hours.   BUN 7 6 - 20 mg/dL   Creatinine, Ser 0.62 0.44 - 1.00 mg/dL   Calcium 9.4 8.9 - 37.6 mg/dL   GFR, Estimated >28 >31 mL/min    Comment: (NOTE) Calculated using the CKD-EPI Creatinine Equation (2021)    Anion gap 13 5 - 15    Comment: Performed at [facility], [address] [place], [place] [address]  CBC     Status: Abnormal   Collection Time: 04/10/22  4:41 AM  Result Value Ref Range   WBC 20.8 (H) 4.0 - 10.5 K/uL   RBC 4.78 3.87 - 5.11 MIL/uL   Hemoglobin 13.4 12.0 - 15.0 g/dL   HCT 60.7 37.1 - 06.2 %   MCV 85.6 80.0 - 100.0 fL   MCH 28.0 26.0 - 34.0 pg   MCHC 32.8 30.0 - 36.0 g/dL   RDW 69.4 85.4 - 62.7 %   Platelets 415 (H) 150 - 400 K/uL   nRBC 0.0 0.0 - 0.2 %    Comment: Performed at [facility], [address] [place], [place] [address]  Glucose, capillary     Status: Abnormal   Collection Time: 04/10/22  8:30 AM  Result Value Ref Range   Glucose-Capillary 166 (H) 70 - 99 mg/dL    Comment: Glucose reference range applies only to samples taken after fasting for at least 8 hours.  Glucose, capillary     Status: Abnormal   Collection Time: 04/10/22 12:18 PM  Result Value Ref Range   Glucose-Capillary 120 (H) 70 - 99 mg/dL    Comment: Glucose reference range applies only to samples taken after fasting for at least 8 hours.    Current Facility-Administered Medications  Medication Dose Route Frequency Provider Last Rate Last Admin   albuterol (PROVENTIL) (2.5 MG/3ML) 0.083% nebulizer solution  3 mL  3 mL Nebulization Q4H PRN Agbata, Tochukwu, MD       buPROPion ER (WELLBUTRIN SR) 12 hr tablet 100 mg  100 mg Oral BID Agbata, Tochukwu, MD   100 mg at 04/10/22 0915   enoxaparin (LOVENOX) injection 40 mg  40 mg Subcutaneous Q24H Agbata, Tochukwu, MD   40 mg at 04/09/22 2241   ezetimibe (ZETIA) tablet 10 mg  10 mg Oral Daily Agbata, Tochukwu, MD   10 mg at 04/10/22 0915   gabapentin (NEURONTIN) capsule 100 mg  100 mg Oral TID Agbata, Tochukwu, MD   100 mg at 04/10/22 0914   gemfibrozil (LOPID) tablet 600 mg  600 mg Oral BID Agbata, Tochukwu, MD   600 mg at 04/10/22 0915   insulin aspart (novoLOG) injection 0-15 Units  0-15 Units Subcutaneous TID WC Agbata, Tochukwu, MD   3 Units at 04/10/22 0916   insulin detemir (LEVEMIR) injection 25 Units  25 Units Subcutaneous QHS Agbata, Tochukwu, MD   25 Units at 04/09/22 2241   nicotine (NICODERM CQ - dosed in mg/24 hours) patch 14 mg  14 mg Transdermal Daily Agbata, Tochukwu, MD   14 mg at 04/10/22 0914   ondansetron (ZOFRAN) tablet 4 mg  4 mg Oral Q6H PRN Agbata, Tochukwu, MD   4 mg at 04/09/22 2240   Or   ondansetron (ZOFRAN) injection 4 mg  4 mg Intravenous Q6H PRN Agbata, Tochukwu, MD       QUEtiapine (SEROQUEL) tablet 50 mg  50 mg Oral QHS Agbata, Tochukwu, MD       sertraline (ZOLOFT) tablet 100 mg  100 mg Oral BID Agbata, Tochukwu, MD   100 mg at 04/10/22 0914   ziprasidone (GEODON) capsule 80 mg  80 mg Oral BID WC Joelle Flessner, Jackquline Denmark, MD        Musculoskeletal: Strength & Muscle Tone: within normal limits Gait & Station: normal Patient leans: N/A            Psychiatric Specialty Exam:  Presentation  General Appearance: No data recorded Eye Contact:No data recorded Speech:No data recorded Speech Volume:No data recorded Handedness:No data recorded  Mood and Affect  Mood:No data recorded Affect:No data recorded  Thought Process  Thought Processes:No data recorded Descriptions of Associations:No data  recorded Orientation:No data recorded Thought Content:No data recorded History of Schizophrenia/Schizoaffective disorder:No data recorded Duration of Psychotic Symptoms:No data recorded Hallucinations:No data recorded Ideas of Reference:No data recorded Suicidal Thoughts:No data recorded Homicidal Thoughts:No data recorded  Sensorium  Memory:No data recorded Judgment:No data recorded Insight:No data recorded  Executive Functions  Concentration:No data recorded Attention Span:No data recorded Recall:No data recorded Fund of Knowledge:No data recorded Language:No data recorded  Psychomotor Activity  Psychomotor Activity:No data recorded  Assets  Assets:No data recorded  Sleep  Sleep:No data recorded  Physical Exam: Physical Exam Vitals and nursing note reviewed.  Constitutional:      Appearance: Normal appearance.  HENT:     Head: Normocephalic and atraumatic.     Mouth/Throat:     Pharynx: Oropharynx is clear.  Eyes:     Pupils: Pupils are equal, round, and reactive to light.  Cardiovascular:     Rate and Rhythm: Normal rate and regular rhythm.  Pulmonary:     Effort: Pulmonary effort is normal.     Breath sounds: Normal breath sounds.  Abdominal:     General: Abdomen is flat.     Palpations: Abdomen is soft.  Musculoskeletal:        General: Normal range of  motion.  Skin:    General: Skin is warm and dry.  Neurological:     General: No focal deficit present.     Mental Status: She is alert. Mental status is at baseline.  Psychiatric:        Attention and Perception: She is inattentive. She perceives auditory hallucinations.        Mood and Affect: Mood normal. Affect is blunt.        Speech: Speech is delayed.        Behavior: Behavior is slowed.        Thought Content: Thought content normal. Thought content does not include homicidal or suicidal ideation.    Review of Systems  Constitutional: Negative.   HENT: Negative.    Eyes: Negative.    Respiratory: Negative.    Cardiovascular: Negative.   Gastrointestinal: Negative.   Musculoskeletal: Negative.   Skin: Negative.   Neurological: Negative.   Psychiatric/Behavioral:  Positive for hallucinations. Negative for depression, memory loss, substance abuse and suicidal ideas. The patient is not nervous/anxious and does not have insomnia.    Blood pressure 103/77, pulse 87, temperature 98.6 F (37 C), resp. rate 18, height 5\' 7"  (1.702 m), weight 93.2 kg, last menstrual period 04/06/2022, SpO2 98 %. Body mass index is 32.18 kg/m.  Treatment Plan Summary: Medication management and Plan patient acknowledges her hallucinations have been more bothersome lately but is denying suicidal or homicidal ideation.  She is not aggressive or threatening and states that she is compliant with her medicines and her outpatient follow-up.  No indication currently that she needs inpatient treatment.  She is already telling me that her headache is much better and that overall she is feeling physically better.  After reviewing her medicines I note that her Geodon dose of 60 mg twice a day is below the usual minimum recommended to treat psychosis.  I propose increasing the dose to 80 mg twice a day.  Patient agrees.  I also reviewed with the patient the importance information that Geodon must always be taken along with food and it is recommended to ingest at least 350 cal at the time he take Geodon in order to facilitate absorption.  Patient says she is already aware of that.  No indication for commitment no indication for inpatient hospitalization.  Case reviewed with hospitalist.  Patient can follow-up with her usual outpatient doctor.  Disposition: No evidence of imminent risk to self or others at present.   Patient does not meet criteria for psychiatric inpatient admission. Supportive therapy provided about ongoing stressors. Discussed crisis plan, support from social network, calling 911, coming to the  Emergency Department, and calling Suicide Hotline.  04/08/2022, MD 04/10/2022 1:28 PM

## 2022-04-10 NOTE — Assessment & Plan Note (Signed)
Looking back at prior labs this has been elevated for few years now.  Recommend following up with hematology as outpatient

## 2022-04-10 NOTE — Assessment & Plan Note (Signed)
Continue Jardiance and metformin.  Can go back on Levemir insulin 30 units in the evening.  If sugars remain elevated can go back up to 35 units in the evening.  Recommend checking sugars 4 times a day.

## 2022-04-10 NOTE — Care Management CC44 (Signed)
Condition Code 44 Documentation Completed  Patient Details  Name: DHIYA SMITS MRN: [id number] Date of Birth: 02-Jan-1986   Condition Code 44 given:  Yes Patient signature on Condition Code 44 notice:  Yes Documentation of 2 MD's agreement:  Yes Code 44 added to claim:  Yes    Laurena Slimmer, RN 04/10/2022, 2:57 PM

## 2022-04-10 NOTE — TOC Initial Note (Signed)
Transition of Care [facility]) - Initial/Assessment Note    Patient Details  Name: Olivia Olson MRN: [id number] Date of Birth: 07-07-1986  Transition of Care [facility]) CM/SW Contact:    Laurena Slimmer, RN Phone Number: 04/10/2022, 11:30 AM  Clinical Narrative:                  Transition of Care [facility]) Screening Note   Patient Details  Name: Olivia Olson Date of Birth: Sep 25, 1985   Transition of Care [facility]) CM/SW Contact:    Laurena Slimmer, RN Phone Number: 04/10/2022, 11:30 AM    Transition of Care Department [facility]) has reviewed patient and no TOC needs have been identified at this time. We will continue to monitor patient advancement through interdisciplinary progression rounds. If new patient transition needs arise, please place a TOC consult.          Patient Goals and CMS Choice        Expected Discharge Plan and Services                                                Prior Living Arrangements/Services                       Activities of Daily Living Home Assistive Devices/Equipment: None ADL Screening (condition at time of admission) Patient's cognitive ability adequate to safely complete daily activities?: Yes Is the patient deaf or have difficulty hearing?: No Does the patient have difficulty seeing, even when wearing glasses/contacts?: No Does the patient have difficulty concentrating, remembering, or making decisions?: No Patient able to express need for assistance with ADLs?: Yes Does the patient have difficulty dressing or bathing?: No Independently performs ADLs?: Yes (appropriate for developmental age) Does the patient have difficulty walking or climbing stairs?: No Weakness of Legs: None Weakness of Arms/Hands: None  Permission Sought/Granted                  Emotional Assessment              Admission diagnosis:  Visual hallucinations [R44.1] Meningitis [G03.9] Bad headache [R51.9] History of  schizophrenia [Z86.59] Nausea and vomiting, unspecified vomiting type [R11.2] Patient Active Problem List   Diagnosis Date Noted   Meningitis 04/09/2022   Schizophrenia ([place])    Diabetes mellitus without complication ([place])    Nicotine dependence    Leukemoid reaction    PCP:  System, Provider Not In Pharmacy:   [facility] [address], [place] [address] Phone: [phone] Fax: [phone]     Social Determinants of Health (SDOH) Interventions    Readmission Risk Interventions     No data to display

## 2022-04-10 NOTE — Progress Notes (Addendum)
Date of Admission:  04/09/2022    ID: Olivia Olson is a 36 y.o. female Principal Problem:   Meningitis Active Problems:   Schizophrenia ([place])   Diabetes mellitus without complication ([place])   Nicotine dependence   Leukemoid reaction    Subjective: Doing much better No headache Walking in the room   Medications:   buPROPion ER  100 mg Oral BID   enoxaparin (LOVENOX) injection  40 mg Subcutaneous Q24H   ezetimibe  10 mg Oral Daily   gabapentin  100 mg Oral TID   gemfibrozil  600 mg Oral BID   insulin aspart  0-15 Units Subcutaneous TID WC   insulin detemir  25 Units Subcutaneous QHS   nicotine  14 mg Transdermal Daily   QUEtiapine  50 mg Oral QHS   sertraline  100 mg Oral BID   ziprasidone  60 mg Oral BID WC    Objective: Vital signs in last 24 hours: Patient Vitals for the past 24 hrs:  BP Temp Temp src Pulse Resp SpO2 Height Weight  04/10/22 0833 103/77 98.6 F (37 C) -- 87 18 98 % -- --  04/10/22 0328 117/77 98.7 F (37.1 C) Oral 94 18 -- -- --  04/10/22 0005 118/76 99.2 F (37.3 C) Oral 90 17 95 % -- --  04/09/22 1951 116/75 99.4 F (37.4 C) -- 92 17 94 % 5\' 7"  (1.702 m) 93.2 kg  04/09/22 1828 -- -- -- 89 -- 95 % -- --  04/09/22 1815 -- -- -- 76 -- 94 % -- --  04/09/22 1736 -- 98.7 F (37.1 C) Oral -- -- -- -- --  04/09/22 1730 122/74 -- -- -- -- -- -- --  04/09/22 1600 110/79 -- -- 86 -- 97 % -- --  04/09/22 1530 103/66 -- -- 91 -- 97 % -- --  04/09/22 1430 123/79 -- -- 80 -- 94 % -- --  04/09/22 1200 122/88 -- -- (!) 107 -- 97 % -- --      PHYSICAL EXAM:  General: Alert, cooperative, no distress, appears stated age.  Head: Normocephalic, without obvious abnormality, atraumatic. Eyes: Conjunctivae clear, anicteric sclerae. Pupils are equal ENT Nares normal. No drainage or sinus tenderness. Lips, mucosa, and tongue normal. No Thrush Neck: Supple, symmetrical, no adenopathy, thyroid: non tender no carotid bruit and no JVD. Back: No CVA  tenderness. Lungs: Clear to auscultation bilaterally. No Wheezing or Rhonchi. No rales. Heart: Regular rate and rhythm, no murmur, rub or gallop. Abdomen: Soft, non-tender,not distended. Bowel sounds normal. No masses Extremities: atraumatic, no cyanosis. No edema. No clubbing Skin: No rashes or lesions. Or bruising Lymph: Cervical, supraclavicular normal. Neurologic: Grossly non-focal  Lab Results Recent Labs    04/09/22 0627 04/09/22 1225 04/10/22 0441  WBC 21.6*  --  20.8*  HGB 16.2*  --  13.4  HCT 49.1*  --  40.9  NA 135 139 140  K 3.9 4.4 3.9  CL 103 103 104  CO2 20* 21* 23  BUN 11 10 7   CREATININE 0.64 0.50 0.58   Liver Panel Recent Labs    04/09/22 0627  PROT 9.0*  ALBUMIN 4.9  AST 23  ALT 24  ALKPHOS 108  BILITOT 0.7    Microbiology: 9/28/ BC NG CSF ME panel negative Studies/Results: DG Chest Portable 1 View  Result Date: 04/09/2022 CLINICAL DATA:  Headache, nausea, vomiting EXAM: PORTABLE CHEST 1 VIEW COMPARISON:  Chest radiograph 04/07/2010 FINDINGS: The cardiomediastinal silhouette is normal There is no  focal consolidation or pulmonary edema. There is no pleural effusion or pneumothorax. There is no acute osseous abnormality. IMPRESSION: No radiographic evidence of acute cardiopulmonary process. Electronically Signed   By: Valetta Mole M.D.   On: 04/09/2022 11:05   CT HEAD WO CONTRAST (5MM)  Result Date: 04/09/2022 CLINICAL DATA:  Headache EXAM: CT HEAD WITHOUT CONTRAST TECHNIQUE: Contiguous axial images were obtained from the base of the skull through the vertex without intravenous contrast. RADIATION DOSE REDUCTION: This exam was performed according to the departmental dose-optimization program which includes automated exposure control, adjustment of the mA and/or kV according to patient size and/or use of iterative reconstruction technique. COMPARISON:  Head CT 07/07/2011 FINDINGS: Brain: There is no acute intracranial hemorrhage, extra-axial fluid  collection, or acute infarct. Parenchymal volume is normal. The ventricles are normal in size. Gray-white differentiation is preserved. There is no mass lesion.  There is no mass effect or midline shift. Vascular: No hyperdense vessel or unexpected calcification. Skull: Normal. Negative for fracture or focal lesion. Sinuses/Orbits: The paranasal sinuses are clear. The globes and orbits are unremarkable. Other: None. IMPRESSION: Normal head CT. Electronically Signed   By: Valetta Mole M.D.   On: 04/09/2022 10:57     Assessment/Plan: pt presenting with nausea, headache, tremors , spasms  Has schizophrenia and on multiple medications Also has migraine No fever LP done - csf cell count N Minimally elevated protein at 59 Clinically and by CSF no  evidence of meningitis neither viral or bacterial She has leucocytosis but that has been present since 2015 Unclear reason- recommend heme consult   Could she have an acute attack of  migraine? Could she have medications side effects from multiple schizophrenia drugs Pt has already received a dose of vanco and ceftriaxone and good for 24 hrs ME panel neg blood culture Neg No antibiotics needed  Diabetes mellitus on insulin  Discussed the management with patient and hosptialist

## 2022-04-10 NOTE — Care Management Important Message (Signed)
Important Message  Patient Details  Name: UDELL BLASINGAME MRN: [id number] Date of Birth: 1985-08-22   Medicare Important Message Given:  Yes     Laurena Slimmer, RN 04/10/2022, 2:57 PM

## 2022-04-11 LAB — HIV ANTIBODY (ROUTINE TESTING W REFLEX): HIV Screen 4th Generation wRfx: NONREACTIVE

## 2022-04-12 LAB — CSF CULTURE W GRAM STAIN
Culture: NO GROWTH
Special Requests: NORMAL

## 2022-04-14 DIAGNOSIS — E119 Type 2 diabetes mellitus without complications: Secondary | ICD-10-CM | POA: Diagnosis not present

## 2022-04-14 DIAGNOSIS — F209 Schizophrenia, unspecified: Secondary | ICD-10-CM | POA: Diagnosis not present

## 2022-04-14 DIAGNOSIS — Z0130 Encounter for examination of blood pressure without abnormal findings: Secondary | ICD-10-CM | POA: Diagnosis not present

## 2022-04-14 DIAGNOSIS — D72829 Elevated white blood cell count, unspecified: Secondary | ICD-10-CM | POA: Diagnosis not present

## 2022-04-14 DIAGNOSIS — Z1389 Encounter for screening for other disorder: Secondary | ICD-10-CM | POA: Diagnosis not present

## 2022-04-14 LAB — CULTURE, BLOOD (ROUTINE X 2)
Culture: NO GROWTH
Culture: NO GROWTH

## 2022-05-04 ENCOUNTER — Inpatient Hospital Stay: Payer: Medicare HMO

## 2022-05-04 ENCOUNTER — Encounter: Payer: Self-pay | Admitting: Oncology

## 2022-05-04 ENCOUNTER — Inpatient Hospital Stay: Payer: Medicare HMO | Attending: Oncology | Admitting: Oncology

## 2022-05-04 VITALS — BP 114/71 | HR 89 | Temp 96.7°F | Resp 18 | Wt 200.0 lb

## 2022-05-04 DIAGNOSIS — Z720 Tobacco use: Secondary | ICD-10-CM | POA: Diagnosis not present

## 2022-05-04 DIAGNOSIS — R110 Nausea: Secondary | ICD-10-CM | POA: Diagnosis not present

## 2022-05-04 DIAGNOSIS — D751 Secondary polycythemia: Secondary | ICD-10-CM | POA: Diagnosis not present

## 2022-05-04 DIAGNOSIS — D75839 Thrombocytosis, unspecified: Secondary | ICD-10-CM | POA: Insufficient documentation

## 2022-05-04 DIAGNOSIS — D72829 Elevated white blood cell count, unspecified: Secondary | ICD-10-CM | POA: Diagnosis not present

## 2022-05-04 DIAGNOSIS — E119 Type 2 diabetes mellitus without complications: Secondary | ICD-10-CM | POA: Diagnosis not present

## 2022-05-04 DIAGNOSIS — F17210 Nicotine dependence, cigarettes, uncomplicated: Secondary | ICD-10-CM | POA: Diagnosis not present

## 2022-05-04 DIAGNOSIS — Z801 Family history of malignant neoplasm of trachea, bronchus and lung: Secondary | ICD-10-CM | POA: Insufficient documentation

## 2022-05-04 DIAGNOSIS — Z800 Family history of malignant neoplasm of digestive organs: Secondary | ICD-10-CM | POA: Diagnosis not present

## 2022-05-04 LAB — COMPREHENSIVE METABOLIC PANEL
ALT: 27 U/L (ref 0–44)
AST: 21 U/L (ref 15–41)
Albumin: 4.7 g/dL (ref 3.5–5.0)
Alkaline Phosphatase: 122 U/L (ref 38–126)
Anion gap: 11 (ref 5–15)
BUN: 13 mg/dL (ref 6–20)
CO2: 22 mmol/L (ref 22–32)
Calcium: 9.8 mg/dL (ref 8.9–10.3)
Chloride: 100 mmol/L (ref 98–111)
Creatinine, Ser: 0.61 mg/dL (ref 0.44–1.00)
GFR, Estimated: >60 mL/min (ref >60)
Glucose, Bld: 149 mg/dL — ABNORMAL HIGH (ref 70–99)
Potassium: 4.2 mmol/L (ref 3.5–5.1)
Sodium: 133 mmol/L — ABNORMAL LOW (ref 135–145)
Total Bilirubin: 0.4 mg/dL (ref 0.3–1.2)
Total Protein: 8.9 g/dL — ABNORMAL HIGH (ref 6.5–8.1)

## 2022-05-04 LAB — CBC WITH DIFFERENTIAL/PLATELET
Abs Immature Granulocytes: 0.18 10*3/uL — ABNORMAL HIGH (ref 0.00–0.07)
Basophils Absolute: 0.1 10*3/uL (ref 0.0–0.1)
Basophils Relative: 1 %
Eosinophils Absolute: 0.6 10*3/uL — ABNORMAL HIGH (ref 0.0–0.5)
Eosinophils Relative: 3 %
HCT: 48 % — ABNORMAL HIGH (ref 36.0–46.0)
Hemoglobin: 15.9 g/dL — ABNORMAL HIGH (ref 12.0–15.0)
Immature Granulocytes: 1 %
Lymphocytes Relative: 19 %
Lymphs Abs: 4.1 10*3/uL — ABNORMAL HIGH (ref 0.7–4.0)
MCH: 28.1 pg (ref 26.0–34.0)
MCHC: 33.1 g/dL (ref 30.0–36.0)
MCV: 84.8 fL (ref 80.0–100.0)
Monocytes Absolute: 1 10*3/uL (ref 0.1–1.0)
Monocytes Relative: 4 %
Neutro Abs: 15.7 10*3/uL — ABNORMAL HIGH (ref 1.7–7.7)
Neutrophils Relative %: 72 %
Platelets: 411 10*3/uL — ABNORMAL HIGH (ref 150–400)
RBC: 5.66 MIL/uL — ABNORMAL HIGH (ref 3.87–5.11)
RDW: 14.2 % (ref 11.5–15.5)
WBC: 21.6 10*3/uL — ABNORMAL HIGH (ref 4.0–10.5)
nRBC: 0 % (ref 0.0–0.2)

## 2022-05-04 LAB — TECHNOLOGIST SMEAR REVIEW: Plt Morphology: INCREASED

## 2022-05-04 LAB — IRON AND TIBC
Iron: 87 ug/dL (ref 28–170)
Saturation Ratios: 16 % (ref 10.4–31.8)
TIBC: 557 ug/dL — ABNORMAL HIGH (ref 250–450)
UIBC: 470 ug/dL

## 2022-05-04 LAB — HEPATITIS PANEL, ACUTE
HCV Ab: NONREACTIVE
Hep A IgM: NONREACTIVE
Hep B C IgM: NONREACTIVE
Hepatitis B Surface Ag: NONREACTIVE

## 2022-05-04 LAB — FERRITIN: Ferritin: 45 ng/mL (ref 11–307)

## 2022-05-04 LAB — HIV ANTIBODY (ROUTINE TESTING W REFLEX): HIV Screen 4th Generation wRfx: NONREACTIVE

## 2022-05-04 LAB — LACTATE DEHYDROGENASE: LDH: 147 U/L (ref 98–192)

## 2022-05-04 NOTE — Assessment & Plan Note (Signed)
Likely reactive. Pending above.

## 2022-05-04 NOTE — Assessment & Plan Note (Signed)
Recommend smoke cessation. She is not interested.

## 2022-05-04 NOTE — Progress Notes (Signed)
[facility] CONSULT NOTE  Patient Care Team: System, Provider Not In as PCP - General  ASSESSMENT & PLAN  Leukocytosis Labs reviewed and discussed with patient that Leukocytosis, can be secondary to infection, chronic inflammation, smoking, autoimmune disease, or underlying bone marrow disorders.  Likely reactive to smoking, rule out other etiologies.  For the work up of patient's leukocytosis, I recommend checking CBC;CMP, LDH, smear review, peripheral flowcytometry, myeloma panel. Hepatitis, HIV. Jak2 mutation with reflex.    Thrombocytosis Pending above work up.   Tobacco abuse Recommend smoke cessation. She is not interested.   Erythrocytosis Likely reactive. Pending above.    Orders Placed This Encounter  Procedures   Comprehensive metabolic panel    Standing Status:   Future    Number of Occurrences:   1    Standing Expiration Date:   05/05/2023   CBC with Differential/Platelet    Standing Status:   Future    Number of Occurrences:   1    Standing Expiration Date:   05/05/2023   HIV Antibody (routine testing w rflx)    Standing Status:   Future    Number of Occurrences:   1    Standing Expiration Date:   05/05/2023   Hepatitis panel, acute    Standing Status:   Future    Number of Occurrences:   1    Standing Expiration Date:   05/05/2023   Flow cytometry panel-leukemia/lymphoma work-up    Standing Status:   Future    Number of Occurrences:   1    Standing Expiration Date:   05/05/2023   Lactate dehydrogenase    Standing Status:   Future    Number of Occurrences:   1    Standing Expiration Date:   05/05/2023   Kappa/lambda light chains    Standing Status:   Future    Number of Occurrences:   1    Standing Expiration Date:   05/05/2023   Multiple Myeloma Panel (SPEP&IFE w/QIG)    Standing Status:   Future    Number of Occurrences:   1    Standing Expiration Date:   05/05/2023   Ferritin    Standing Status:   Future    Number of  Occurrences:   1    Standing Expiration Date:   11/03/2022   Iron and TIBC    Standing Status:   Future    Number of Occurrences:   1    Standing Expiration Date:   05/05/2023   JAK2 V617F rfx CALR/MPL/E12-15    Standing Status:   Future    Number of Occurrences:   1    Standing Expiration Date:   05/05/2023   Technologist smear review    Standing Status:   Future    Number of Occurrences:   1    Standing Expiration Date:   05/05/2023    Order Specific Question:   Clinical information:    Answer:   leukocytosis thrombocytosis   Follow up 3-4 weeks to review results.  All questions were answered. The patient knows to call the clinic with any problems, questions or concerns.    Earlie Server, MD, PhD [facility] Hematology Oncology 05/04/2022       CHIEF COMPLAINTS/PURPOSE OF CONSULTATION:  Leukocytosis/elevate white count  HISTORY OF PRESENTING ILLNESS:  Olivia Olson 36 y.o. female is here because of elevated WBC.  She was found to have abnormal CBC on 04/10/22 with hemoglobin of 16.4, mcv 84.4, wbc 21.6,  platelet 528,000. Predominantly neutrophilia, lymphocytosis, and monocytosis.  She denies recent infection. Recent admission due to headache.  There is not reported symptoms of sinus congestion, cough, urinary frequency/urgency or dysuria, diarrhea, joint swelling/pain or abnormal skin rash.  She had no prior history or diagnosis of cancer. She smokes daily, 1.5 pack per day The patient has no prior diagnosis of autoimmune disease and was not prescribed corticosteroids related products.   MEDICAL HISTORY:  Past Medical History:  Diagnosis Date   Diabetes mellitus without complication ([place])    Migraines    Schizophrenia ([place])     SURGICAL HISTORY: Past Surgical History:  Procedure Laterality Date   CESAREAN SECTION     GANGLION CYST EXCISION Left    LAPAROSCOPIC OOPHERECTOMY      SOCIAL HISTORY: Social History   Socioeconomic History   Marital status: Single     Spouse name: Not on file   Number of children: Not on file   Years of education: Not on file   Highest education level: Not on file  Occupational History   Not on file  Tobacco Use   Smoking status: Every Day    Packs/day: 1.50    Years: 24.00    Total pack years: 36.00    Types: Cigarettes   Smokeless tobacco: Never  Vaping Use   Vaping Use: Never used  Substance and Sexual Activity   Alcohol use: Never    Comment: occasional drinking.   Drug use: Yes    Types: Marijuana   Sexual activity: Not on file  Other Topics Concern   Not on file  Social History Narrative   Not on file   Social Determinants of Health   Financial Resource Strain: Not on file  Food Insecurity: No Food Insecurity (04/09/2022)   Hunger Vital Sign    Worried About Running Out of Food in the Last Year: Never true    Ran Out of Food in the Last Year: Never true  Transportation Needs: No Transportation Needs (04/09/2022)   PRAPARE - Hydrologist (Medical): No    Lack of Transportation (Non-Medical): No  Physical Activity: Not on file  Stress: Not on file  Social Connections: Not on file  Intimate Partner Violence: Not At Risk (04/09/2022)   Humiliation, Afraid, Rape, and Kick questionnaire    Fear of Current or Ex-Partner: No    Emotionally Abused: No    Physically Abused: No    Sexually Abused: No    FAMILY HISTORY: Family History  Problem Relation Age of Onset   Diabetes Mother    Diabetes Maternal Aunt    Diabetes Maternal Aunt    Liver cancer Maternal Grandmother    Lung cancer Maternal Grandfather     ALLERGIES:  is allergic to codeine, abilify [aripiprazole], morphine and related, risperdal [risperidone], and vicodin [hydrocodone-acetaminophen].  MEDICATIONS:  Current Outpatient Medications  Medication Sig Dispense Refill   buPROPion (WELLBUTRIN SR) 100 MG 12 hr tablet Take 100 mg by mouth 2 (two) times daily.     folic acid (FOLVITE) 144 MCG tablet  Take by mouth.     gabapentin (NEURONTIN) 100 MG capsule Take 100 mg by mouth 3 (three) times daily.     gemfibrozil (LOPID) 600 MG tablet Take 600 mg by mouth 2 (two) times daily.     insulin detemir (LEVEMIR) 100 UNIT/ML FlexPen Inject 30 Units into the skin at bedtime. 15 mL 11   JARDIANCE 10 MG TABS tablet Take 10 mg by  mouth daily.     lisinopril (ZESTRIL) 2.5 MG tablet Take 2.5 mg by mouth daily.     metFORMIN (GLUCOPHAGE) 1000 MG tablet Take 1,000 mg by mouth 2 (two) times daily.     QUEtiapine (SEROQUEL) 50 MG tablet Take 50 mg by mouth daily.     sertraline (ZOLOFT) 100 MG tablet Take 100 mg by mouth 2 (two) times daily.     ziprasidone (GEODON) 80 MG capsule Take 1 capsule (80 mg total) by mouth 2 (two) times daily with a meal. 60 capsule 0   ezetimibe (ZETIA) 10 MG tablet Take 10 mg by mouth daily. (Patient not taking: Reported on 04/10/2022)     ondansetron (ZOFRAN ODT) 4 MG disintegrating tablet Take 1 tablet (4 mg total) by mouth every 8 (eight) hours as needed for nausea or vomiting. (Patient not taking: Reported on 05/04/2022) 20 tablet 0   VENTOLIN HFA 108 (90 Base) MCG/ACT inhaler Inhale 2 puffs into the lungs every 4 (four) hours as needed. (Patient not taking: Reported on 05/04/2022)     No current facility-administered medications for this visit.    Review of Systems  Constitutional:  Negative for appetite change, chills, fatigue and fever.  HENT:   Negative for hearing loss and voice change.   Eyes:  Negative for eye problems.  Respiratory:  Negative for chest tightness and cough.   Cardiovascular:  Negative for chest pain.  Gastrointestinal:  Negative for abdominal distention, abdominal pain and blood in stool.  Endocrine: Negative for hot flashes.  Genitourinary:  Negative for difficulty urinating and frequency.   Musculoskeletal:  Negative for arthralgias.  Skin:  Negative for itching and rash.  Neurological:  Negative for extremity weakness.  Hematological:   Negative for adenopathy.  Psychiatric/Behavioral:  Negative for confusion.      PHYSICAL EXAMINATION: ECOG PERFORMANCE STATUS: 0 - Asymptomatic  Vitals:   05/04/22 1106  BP: 114/71  Pulse: 89  Resp: 18  Temp: (!) 96.7 F (35.9 C)  SpO2: 100%   Filed Weights   05/04/22 1106  Weight: 200 lb (90.7 kg)    Physical Exam Constitutional:      General: She is not in acute distress.    Appearance: She is obese. She is not diaphoretic.  HENT:     Head: Normocephalic and atraumatic.     Nose: Nose normal.     Mouth/Throat:     Pharynx: No oropharyngeal exudate.  Eyes:     General: No scleral icterus.    Pupils: Pupils are equal, round, and reactive to light.  Cardiovascular:     Rate and Rhythm: Normal rate and regular rhythm.     Heart sounds: No murmur heard. Pulmonary:     Effort: Pulmonary effort is normal. No respiratory distress.     Breath sounds: No rales.  Chest:     Chest wall: No tenderness.  Abdominal:     General: There is no distension.     Palpations: Abdomen is soft.     Tenderness: There is no abdominal tenderness.  Musculoskeletal:        General: Normal range of motion.     Cervical back: Normal range of motion and neck supple.  Skin:    General: Skin is warm and dry.     Findings: No erythema.  Neurological:     Mental Status: She is alert and oriented to person, place, and time.     Cranial Nerves: No cranial nerve deficit.     Motor:  No abnormal muscle tone.     Coordination: Coordination normal.  Psychiatric:        Mood and Affect: Affect normal.     LABORATORY DATA:  I have reviewed the data as listed    Latest Ref Rng & Units 05/04/2022   11:47 AM 04/10/2022    4:41 AM 04/09/2022    6:27 AM  CBC  WBC 4.0 - 10.5 K/uL 21.6  20.8  21.6   Hemoglobin 12.0 - 15.0 g/dL 15.9  13.4  16.2   Hematocrit 36.0 - 46.0 % 48.0  40.9  49.1   Platelets 150 - 400 K/uL 411  415  528       Latest Ref Rng & Units 05/04/2022   11:47 AM 04/10/2022     4:41 AM 04/09/2022   12:25 PM  CMP  Glucose 70 - 99 mg/dL 149  187  175   BUN 6 - 20 mg/dL _0 Creatinine 0.44 - 1.00 mg/dL 0.61  0.58  0.50   Sodium 135 - 145 mmol/L 133  140  139   Potassium 3.5 - 5.1 mmol/L 4.2  3.9  4.4   Chloride 98 - 111 mmol/L 100  104  103   CO2 22 - 32 mmol/L _1 Calcium 8.9 - 10.3 mg/dL 9.8  9.4  9.6   Total Protein 6.5 - 8.1 g/dL 8.9     Total Bilirubin 0.3 - 1.2 mg/dL 0.4     Alkaline Phos 38 - 126 U/L 122     AST 15 - 41 U/L 21     ALT 0 - 44 U/L 27      Lab Results  Component Value Date   LDH 147 05/04/2022    RADIOGRAPHIC STUDIES: I have personally reviewed the radiological images as listed and agreed with the findings in the report. DG Chest Portable 1 View  Result Date: 04/09/2022 CLINICAL DATA:  Headache, nausea, vomiting EXAM: PORTABLE CHEST 1 VIEW COMPARISON:  Chest radiograph 04/07/2010 FINDINGS: The cardiomediastinal silhouette is normal There is no focal consolidation or pulmonary edema. There is no pleural effusion or pneumothorax. There is no acute osseous abnormality. IMPRESSION: No radiographic evidence of acute cardiopulmonary process. Electronically Signed   By: Valetta Mole M.D.   On: 04/09/2022 11:05   CT HEAD WO CONTRAST (5MM)  Result Date: 04/09/2022 CLINICAL DATA:  Headache EXAM: CT HEAD WITHOUT CONTRAST TECHNIQUE: Contiguous axial images were obtained from the base of the skull through the vertex without intravenous contrast. RADIATION DOSE REDUCTION: This exam was performed according to the departmental dose-optimization program which includes automated exposure control, adjustment of the mA and/or kV according to patient size and/or use of iterative reconstruction technique. COMPARISON:  Head CT 07/07/2011 FINDINGS: Brain: There is no acute intracranial hemorrhage, extra-axial fluid collection, or acute infarct. Parenchymal volume is normal. The ventricles are normal in size. Gray-white differentiation is preserved.  There is no mass lesion.  There is no mass effect or midline shift. Vascular: No hyperdense vessel or unexpected calcification. Skull: Normal. Negative for fracture or focal lesion. Sinuses/Orbits: The paranasal sinuses are clear. The globes and orbits are unremarkable. Other: None. IMPRESSION: Normal head CT. Electronically Signed   By: Valetta Mole M.D.   On: 04/09/2022 10:57

## 2022-05-04 NOTE — Assessment & Plan Note (Addendum)
Labs reviewed and discussed with patient that Leukocytosis, can be secondary to infection, chronic inflammation, smoking, autoimmune disease, or underlying bone marrow disorders.  Likely reactive to smoking, rule out other etiologies.  For the work up of patient's leukocytosis, I recommend checking CBC;CMP, LDH, smear review, peripheral flowcytometry, myeloma panel. Hepatitis, HIV. Jak2 mutation with reflex.

## 2022-05-04 NOTE — Assessment & Plan Note (Signed)
Pending above workup. 

## 2022-05-05 LAB — KAPPA/LAMBDA LIGHT CHAINS
Kappa free light chain: 24.1 mg/L — ABNORMAL HIGH (ref 3.3–19.4)
Kappa, lambda light chain ratio: 1.21 (ref 0.26–1.65)
Lambda free light chains: 19.9 mg/L (ref 5.7–26.3)

## 2022-05-06 LAB — COMP PANEL: LEUKEMIA/LYMPHOMA

## 2022-05-07 LAB — MULTIPLE MYELOMA PANEL, SERUM
Albumin SerPl Elph-Mcnc: 3.8 g/dL (ref 2.9–4.4)
Albumin/Glob SerPl: 1.1 (ref 0.7–1.7)
Alpha 1: 0.3 g/dL (ref 0.0–0.4)
Alpha2 Glob SerPl Elph-Mcnc: 1 g/dL (ref 0.4–1.0)
B-Globulin SerPl Elph-Mcnc: 1.4 g/dL — ABNORMAL HIGH (ref 0.7–1.3)
Gamma Glob SerPl Elph-Mcnc: 1 g/dL (ref 0.4–1.8)
Globulin, Total: 3.8 g/dL (ref 2.2–3.9)
IgA: 254 mg/dL (ref 87–352)
IgG (Immunoglobin G), Serum: 957 mg/dL (ref 586–1602)
IgM (Immunoglobulin M), Srm: 119 mg/dL (ref 26–217)
Total Protein ELP: 7.6 g/dL (ref 6.0–8.5)

## 2022-05-10 LAB — JAK2 V617F RFX CALR/MPL/E12-15

## 2022-05-10 LAB — CALR +MPL + E12-E15  (REFLEX)

## 2022-06-02 ENCOUNTER — Inpatient Hospital Stay: Payer: Medicare HMO | Attending: Oncology | Admitting: Oncology

## 2022-09-09 DIAGNOSIS — F28 Other psychotic disorder not due to a substance or known physiological condition: Secondary | ICD-10-CM | POA: Diagnosis not present

## 2022-09-25 DIAGNOSIS — F28 Other psychotic disorder not due to a substance or known physiological condition: Secondary | ICD-10-CM | POA: Diagnosis not present

## 2022-09-30 DIAGNOSIS — Z1331 Encounter for screening for depression: Secondary | ICD-10-CM | POA: Diagnosis not present

## 2022-09-30 DIAGNOSIS — F209 Schizophrenia, unspecified: Secondary | ICD-10-CM | POA: Diagnosis not present

## 2022-09-30 DIAGNOSIS — Z124 Encounter for screening for malignant neoplasm of cervix: Secondary | ICD-10-CM | POA: Diagnosis not present

## 2022-09-30 DIAGNOSIS — E559 Vitamin D deficiency, unspecified: Secondary | ICD-10-CM | POA: Diagnosis not present

## 2022-09-30 DIAGNOSIS — R87619 Unspecified abnormal cytological findings in specimens from cervix uteri: Secondary | ICD-10-CM | POA: Diagnosis not present

## 2022-09-30 DIAGNOSIS — Z1389 Encounter for screening for other disorder: Secondary | ICD-10-CM | POA: Diagnosis not present

## 2022-09-30 DIAGNOSIS — Z202 Contact with and (suspected) exposure to infections with a predominantly sexual mode of transmission: Secondary | ICD-10-CM | POA: Diagnosis not present

## 2022-09-30 DIAGNOSIS — E119 Type 2 diabetes mellitus without complications: Secondary | ICD-10-CM | POA: Diagnosis not present

## 2022-09-30 DIAGNOSIS — Z113 Encounter for screening for infections with a predominantly sexual mode of transmission: Secondary | ICD-10-CM | POA: Diagnosis not present

## 2022-09-30 DIAGNOSIS — I10 Essential (primary) hypertension: Secondary | ICD-10-CM | POA: Diagnosis not present

## 2022-10-01 DIAGNOSIS — F209 Schizophrenia, unspecified: Secondary | ICD-10-CM | POA: Diagnosis not present

## 2022-10-01 DIAGNOSIS — E119 Type 2 diabetes mellitus without complications: Secondary | ICD-10-CM | POA: Diagnosis not present

## 2022-10-01 DIAGNOSIS — E559 Vitamin D deficiency, unspecified: Secondary | ICD-10-CM | POA: Diagnosis not present

## 2022-10-01 DIAGNOSIS — I10 Essential (primary) hypertension: Secondary | ICD-10-CM | POA: Diagnosis not present

## 2023-02-22 DIAGNOSIS — E559 Vitamin D deficiency, unspecified: Secondary | ICD-10-CM | POA: Diagnosis not present

## 2023-02-22 DIAGNOSIS — Z0131 Encounter for examination of blood pressure with abnormal findings: Secondary | ICD-10-CM | POA: Diagnosis not present

## 2023-02-22 DIAGNOSIS — F209 Schizophrenia, unspecified: Secondary | ICD-10-CM | POA: Diagnosis not present

## 2023-02-22 DIAGNOSIS — E119 Type 2 diabetes mellitus without complications: Secondary | ICD-10-CM | POA: Diagnosis not present

## 2023-02-22 DIAGNOSIS — Z712 Person consulting for explanation of examination or test findings: Secondary | ICD-10-CM | POA: Diagnosis not present

## 2023-02-22 DIAGNOSIS — R440 Auditory hallucinations: Secondary | ICD-10-CM | POA: Diagnosis not present

## 2023-02-22 DIAGNOSIS — F17210 Nicotine dependence, cigarettes, uncomplicated: Secondary | ICD-10-CM | POA: Diagnosis not present

## 2023-02-22 DIAGNOSIS — D72829 Elevated white blood cell count, unspecified: Secondary | ICD-10-CM | POA: Diagnosis not present

## 2023-02-22 DIAGNOSIS — I10 Essential (primary) hypertension: Secondary | ICD-10-CM | POA: Diagnosis not present

## 2023-02-22 DIAGNOSIS — Z1389 Encounter for screening for other disorder: Secondary | ICD-10-CM | POA: Diagnosis not present

## 2023-03-08 DIAGNOSIS — Z0130 Encounter for examination of blood pressure without abnormal findings: Secondary | ICD-10-CM | POA: Diagnosis not present

## 2023-03-08 DIAGNOSIS — F17210 Nicotine dependence, cigarettes, uncomplicated: Secondary | ICD-10-CM | POA: Diagnosis not present

## 2023-03-08 DIAGNOSIS — Z1389 Encounter for screening for other disorder: Secondary | ICD-10-CM | POA: Diagnosis not present

## 2023-03-08 DIAGNOSIS — Z712 Person consulting for explanation of examination or test findings: Secondary | ICD-10-CM | POA: Diagnosis not present

## 2023-03-08 DIAGNOSIS — K009 Disorder of tooth development, unspecified: Secondary | ICD-10-CM | POA: Diagnosis not present

## 2023-03-08 DIAGNOSIS — E119 Type 2 diabetes mellitus without complications: Secondary | ICD-10-CM | POA: Diagnosis not present

## 2023-03-08 DIAGNOSIS — R899 Unspecified abnormal finding in specimens from other organs, systems and tissues: Secondary | ICD-10-CM | POA: Diagnosis not present

## 2023-03-08 DIAGNOSIS — I10 Essential (primary) hypertension: Secondary | ICD-10-CM | POA: Diagnosis not present

## 2023-03-25 DIAGNOSIS — F28 Other psychotic disorder not due to a substance or known physiological condition: Secondary | ICD-10-CM | POA: Diagnosis not present

## 2023-04-05 DIAGNOSIS — R440 Auditory hallucinations: Secondary | ICD-10-CM | POA: Diagnosis not present

## 2023-04-05 DIAGNOSIS — D72829 Elevated white blood cell count, unspecified: Secondary | ICD-10-CM | POA: Diagnosis not present

## 2023-04-05 DIAGNOSIS — Z712 Person consulting for explanation of examination or test findings: Secondary | ICD-10-CM | POA: Diagnosis not present

## 2023-04-05 DIAGNOSIS — Z1389 Encounter for screening for other disorder: Secondary | ICD-10-CM | POA: Diagnosis not present

## 2023-04-05 DIAGNOSIS — Z0130 Encounter for examination of blood pressure without abnormal findings: Secondary | ICD-10-CM | POA: Diagnosis not present
